# Patient Record
Sex: Male | Born: 1975 | Race: White | Hispanic: No
Health system: Southern US, Community
[De-identification: ages and names within clinical notes are randomized; demographics above are authoritative.]

## PROBLEM LIST (undated history)

## (undated) DIAGNOSIS — K7460 Unspecified cirrhosis of liver: Secondary | ICD-10-CM

## (undated) DIAGNOSIS — K769 Liver disease, unspecified: Secondary | ICD-10-CM

---

## 1999-08-01 ENCOUNTER — Emergency Department (HOSPITAL_COMMUNITY): Admission: EM | Admit: 1999-08-01 | Discharge: 1999-08-01 | Payer: Self-pay | Admitting: Emergency Medicine

## 1999-08-02 ENCOUNTER — Emergency Department (HOSPITAL_COMMUNITY): Admission: EM | Admit: 1999-08-02 | Discharge: 1999-08-02 | Payer: Self-pay | Admitting: Emergency Medicine

## 2020-10-27 ENCOUNTER — Inpatient Hospital Stay (HOSPITAL_COMMUNITY): Payer: 59

## 2020-10-27 ENCOUNTER — Encounter (HOSPITAL_COMMUNITY): Payer: Self-pay | Admitting: Emergency Medicine

## 2020-10-27 ENCOUNTER — Emergency Department (HOSPITAL_COMMUNITY): Payer: 59

## 2020-10-27 ENCOUNTER — Inpatient Hospital Stay (HOSPITAL_COMMUNITY)
Admission: EM | Admit: 2020-10-27 | Discharge: 2020-11-16 | DRG: 870 | Disposition: E | Payer: 59 | Attending: Internal Medicine | Admitting: Internal Medicine

## 2020-10-27 DIAGNOSIS — F10239 Alcohol dependence with withdrawal, unspecified: Secondary | ICD-10-CM | POA: Diagnosis present

## 2020-10-27 DIAGNOSIS — K567 Ileus, unspecified: Secondary | ICD-10-CM | POA: Diagnosis present

## 2020-10-27 DIAGNOSIS — A419 Sepsis, unspecified organism: Secondary | ICD-10-CM | POA: Diagnosis present

## 2020-10-27 DIAGNOSIS — E861 Hypovolemia: Secondary | ICD-10-CM | POA: Diagnosis present

## 2020-10-27 DIAGNOSIS — N170 Acute kidney failure with tubular necrosis: Secondary | ICD-10-CM | POA: Diagnosis present

## 2020-10-27 DIAGNOSIS — G9341 Metabolic encephalopathy: Secondary | ICD-10-CM | POA: Diagnosis not present

## 2020-10-27 DIAGNOSIS — E871 Hypo-osmolality and hyponatremia: Secondary | ICD-10-CM | POA: Diagnosis present

## 2020-10-27 DIAGNOSIS — E872 Acidosis: Secondary | ICD-10-CM | POA: Diagnosis present

## 2020-10-27 DIAGNOSIS — T17800A Unspecified foreign body in other parts of respiratory tract causing asphyxiation, initial encounter: Secondary | ICD-10-CM

## 2020-10-27 DIAGNOSIS — K7030 Alcoholic cirrhosis of liver without ascites: Secondary | ICD-10-CM | POA: Diagnosis present

## 2020-10-27 DIAGNOSIS — J9601 Acute respiratory failure with hypoxia: Secondary | ICD-10-CM

## 2020-10-27 DIAGNOSIS — Z66 Do not resuscitate: Secondary | ICD-10-CM | POA: Diagnosis not present

## 2020-10-27 DIAGNOSIS — J80 Acute respiratory distress syndrome: Secondary | ICD-10-CM | POA: Diagnosis present

## 2020-10-27 DIAGNOSIS — E162 Hypoglycemia, unspecified: Secondary | ICD-10-CM | POA: Diagnosis present

## 2020-10-27 DIAGNOSIS — Z515 Encounter for palliative care: Secondary | ICD-10-CM | POA: Diagnosis not present

## 2020-10-27 DIAGNOSIS — Z87442 Personal history of urinary calculi: Secondary | ICD-10-CM

## 2020-10-27 DIAGNOSIS — G931 Anoxic brain damage, not elsewhere classified: Secondary | ICD-10-CM | POA: Diagnosis present

## 2020-10-27 DIAGNOSIS — D65 Disseminated intravascular coagulation [defibrination syndrome]: Secondary | ICD-10-CM | POA: Diagnosis present

## 2020-10-27 DIAGNOSIS — R578 Other shock: Secondary | ICD-10-CM | POA: Diagnosis present

## 2020-10-27 DIAGNOSIS — R1915 Other abnormal bowel sounds: Secondary | ICD-10-CM

## 2020-10-27 DIAGNOSIS — Z452 Encounter for adjustment and management of vascular access device: Secondary | ICD-10-CM

## 2020-10-27 DIAGNOSIS — K7010 Alcoholic hepatitis without ascites: Secondary | ICD-10-CM | POA: Diagnosis present

## 2020-10-27 DIAGNOSIS — Z20822 Contact with and (suspected) exposure to covid-19: Secondary | ICD-10-CM | POA: Diagnosis present

## 2020-10-27 DIAGNOSIS — R092 Respiratory arrest: Secondary | ICD-10-CM

## 2020-10-27 DIAGNOSIS — I469 Cardiac arrest, cause unspecified: Secondary | ICD-10-CM | POA: Diagnosis present

## 2020-10-27 DIAGNOSIS — R569 Unspecified convulsions: Secondary | ICD-10-CM | POA: Diagnosis present

## 2020-10-27 DIAGNOSIS — J690 Pneumonitis due to inhalation of food and vomit: Secondary | ICD-10-CM | POA: Diagnosis present

## 2020-10-27 DIAGNOSIS — D638 Anemia in other chronic diseases classified elsewhere: Secondary | ICD-10-CM | POA: Diagnosis present

## 2020-10-27 DIAGNOSIS — Z79899 Other long term (current) drug therapy: Secondary | ICD-10-CM

## 2020-10-27 DIAGNOSIS — R6521 Severe sepsis with septic shock: Secondary | ICD-10-CM | POA: Diagnosis present

## 2020-10-27 DIAGNOSIS — K7290 Hepatic failure, unspecified without coma: Secondary | ICD-10-CM | POA: Diagnosis not present

## 2020-10-27 DIAGNOSIS — Z9911 Dependence on respirator [ventilator] status: Secondary | ICD-10-CM | POA: Diagnosis not present

## 2020-10-27 DIAGNOSIS — K767 Hepatorenal syndrome: Secondary | ICD-10-CM | POA: Diagnosis present

## 2020-10-27 DIAGNOSIS — J9600 Acute respiratory failure, unspecified whether with hypoxia or hypercapnia: Secondary | ICD-10-CM

## 2020-10-27 DIAGNOSIS — K922 Gastrointestinal hemorrhage, unspecified: Secondary | ICD-10-CM | POA: Diagnosis present

## 2020-10-27 DIAGNOSIS — Z8719 Personal history of other diseases of the digestive system: Secondary | ICD-10-CM

## 2020-10-27 DIAGNOSIS — Z7189 Other specified counseling: Secondary | ICD-10-CM | POA: Diagnosis not present

## 2020-10-27 HISTORY — DX: Liver disease, unspecified: K76.9

## 2020-10-27 HISTORY — DX: Unspecified cirrhosis of liver: K74.60

## 2020-10-27 LAB — POCT I-STAT 7, (LYTES, BLD GAS, ICA,H+H)
Acid-base deficit: 7 mmol/L — ABNORMAL HIGH (ref 0.0–2.0)
Bicarbonate: 18.8 mmol/L — ABNORMAL LOW (ref 20.0–28.0)
Calcium, Ion: 0.97 mmol/L — ABNORMAL LOW (ref 1.15–1.40)
HCT: 38 % — ABNORMAL LOW (ref 39.0–52.0)
Hemoglobin: 12.9 g/dL — ABNORMAL LOW (ref 13.0–17.0)
O2 Saturation: 99 %
Patient temperature: 97.4
Potassium: 4.2 mmol/L (ref 3.5–5.1)
Sodium: 130 mmol/L — ABNORMAL LOW (ref 135–145)
TCO2: 20 mmol/L — ABNORMAL LOW (ref 22–32)
pCO2 arterial: 38.6 mmHg (ref 32.0–48.0)
pH, Arterial: 7.293 — ABNORMAL LOW (ref 7.350–7.450)
pO2, Arterial: 139 mmHg — ABNORMAL HIGH (ref 83.0–108.0)

## 2020-10-27 LAB — CBC WITH DIFFERENTIAL/PLATELET
Abs Immature Granulocytes: 0.1 10*3/uL — ABNORMAL HIGH (ref 0.00–0.07)
Basophils Absolute: 0 10*3/uL (ref 0.0–0.1)
Basophils Relative: 1 %
Eosinophils Absolute: 0.9 10*3/uL — ABNORMAL HIGH (ref 0.0–0.5)
Eosinophils Relative: 14 %
HCT: 32.8 % — ABNORMAL LOW (ref 39.0–52.0)
Hemoglobin: 11.6 g/dL — ABNORMAL LOW (ref 13.0–17.0)
Immature Granulocytes: 2 %
Lymphocytes Relative: 15 %
Lymphs Abs: 0.9 10*3/uL (ref 0.7–4.0)
MCH: 38.3 pg — ABNORMAL HIGH (ref 26.0–34.0)
MCHC: 35.4 g/dL (ref 30.0–36.0)
MCV: 108.3 fL — ABNORMAL HIGH (ref 80.0–100.0)
Monocytes Absolute: 0.5 10*3/uL (ref 0.1–1.0)
Monocytes Relative: 8 %
Neutro Abs: 3.9 10*3/uL (ref 1.7–7.7)
Neutrophils Relative %: 60 %
Platelets: 48 10*3/uL — ABNORMAL LOW (ref 150–400)
RBC: 3.03 MIL/uL — ABNORMAL LOW (ref 4.22–5.81)
RDW: 16.6 % — ABNORMAL HIGH (ref 11.5–15.5)
WBC: 6.4 10*3/uL (ref 4.0–10.5)
nRBC: 1.3 % — ABNORMAL HIGH (ref 0.0–0.2)

## 2020-10-27 LAB — AMMONIA: Ammonia: 298 umol/L — ABNORMAL HIGH (ref 9–35)

## 2020-10-27 LAB — I-STAT ARTERIAL BLOOD GAS, ED
Acid-base deficit: 13 mmol/L — ABNORMAL HIGH (ref 0.0–2.0)
Bicarbonate: 15.3 mmol/L — ABNORMAL LOW (ref 20.0–28.0)
Calcium, Ion: 1 mmol/L — ABNORMAL LOW (ref 1.15–1.40)
HCT: 35 % — ABNORMAL LOW (ref 39.0–52.0)
Hemoglobin: 11.9 g/dL — ABNORMAL LOW (ref 13.0–17.0)
O2 Saturation: 98 %
Patient temperature: 93.2
Potassium: 3.9 mmol/L (ref 3.5–5.1)
Sodium: 127 mmol/L — ABNORMAL LOW (ref 135–145)
TCO2: 17 mmol/L — ABNORMAL LOW (ref 22–32)
pCO2 arterial: 38 mmHg (ref 32.0–48.0)
pH, Arterial: 7.196 — CL (ref 7.350–7.450)
pO2, Arterial: 112 mmHg — ABNORMAL HIGH (ref 83.0–108.0)

## 2020-10-27 LAB — I-STAT CHEM 8, ED
BUN: 27 mg/dL — ABNORMAL HIGH (ref 6–20)
Calcium, Ion: 0.82 mmol/L — CL (ref 1.15–1.40)
Chloride: 96 mmol/L — ABNORMAL LOW (ref 98–111)
Creatinine, Ser: 1.2 mg/dL (ref 0.61–1.24)
Glucose, Bld: 61 mg/dL — ABNORMAL LOW (ref 70–99)
HCT: 42 % (ref 39.0–52.0)
Hemoglobin: 14.3 g/dL (ref 13.0–17.0)
Potassium: 4.7 mmol/L (ref 3.5–5.1)
Sodium: 124 mmol/L — ABNORMAL LOW (ref 135–145)
TCO2: 15 mmol/L — ABNORMAL LOW (ref 22–32)

## 2020-10-27 LAB — COMPREHENSIVE METABOLIC PANEL
ALT: 111 U/L — ABNORMAL HIGH (ref 0–44)
AST: 264 U/L — ABNORMAL HIGH (ref 15–41)
Albumin: 1.7 g/dL — ABNORMAL LOW (ref 3.5–5.0)
Alkaline Phosphatase: 104 U/L (ref 38–126)
Anion gap: 23 — ABNORMAL HIGH (ref 5–15)
BUN: 19 mg/dL (ref 6–20)
CO2: 13 mmol/L — ABNORMAL LOW (ref 22–32)
Calcium: 8.1 mg/dL — ABNORMAL LOW (ref 8.9–10.3)
Chloride: 90 mmol/L — ABNORMAL LOW (ref 98–111)
Creatinine, Ser: 1.54 mg/dL — ABNORMAL HIGH (ref 0.61–1.24)
GFR, Estimated: 57 mL/min — ABNORMAL LOW (ref >60)
Glucose, Bld: 65 mg/dL — ABNORMAL LOW (ref 70–99)
Potassium: 5.3 mmol/L — ABNORMAL HIGH (ref 3.5–5.1)
Sodium: 126 mmol/L — ABNORMAL LOW (ref 135–145)
Total Bilirubin: 9.1 mg/dL — ABNORMAL HIGH (ref 0.3–1.2)
Total Protein: 7.2 g/dL (ref 6.5–8.1)

## 2020-10-27 LAB — I-STAT VENOUS BLOOD GAS, ED
Acid-base deficit: 15 mmol/L — ABNORMAL HIGH (ref 0.0–2.0)
Bicarbonate: 12 mmol/L — ABNORMAL LOW (ref 20.0–28.0)
Calcium, Ion: 0.8 mmol/L — CL (ref 1.15–1.40)
HCT: 41 % (ref 39.0–52.0)
Hemoglobin: 13.9 g/dL (ref 13.0–17.0)
O2 Saturation: 97 %
Potassium: 4.7 mmol/L (ref 3.5–5.1)
Sodium: 125 mmol/L — ABNORMAL LOW (ref 135–145)
TCO2: 13 mmol/L — ABNORMAL LOW (ref 22–32)
pCO2, Ven: 31.9 mmHg — ABNORMAL LOW (ref 44.0–60.0)
pH, Ven: 7.182 — CL (ref 7.250–7.430)
pO2, Ven: 115 mmHg — ABNORMAL HIGH (ref 32.0–45.0)

## 2020-10-27 LAB — MAGNESIUM
Magnesium: 3 mg/dL — ABNORMAL HIGH (ref 1.7–2.4)
Magnesium: 2.1 mg/dL (ref 1.7–2.4)

## 2020-10-27 LAB — CBC
HCT: 38.9 % — ABNORMAL LOW (ref 39.0–52.0)
HCT: 32.8 % — ABNORMAL LOW (ref 39.0–52.0)
Hemoglobin: 13 g/dL (ref 13.0–17.0)
Hemoglobin: 11.4 g/dL — ABNORMAL LOW (ref 13.0–17.0)
MCH: 38.5 pg — ABNORMAL HIGH (ref 26.0–34.0)
MCH: 38.8 pg — ABNORMAL HIGH (ref 26.0–34.0)
MCHC: 33.4 g/dL (ref 30.0–36.0)
MCHC: 34.8 g/dL (ref 30.0–36.0)
MCV: 115.1 fL — ABNORMAL HIGH (ref 80.0–100.0)
MCV: 111.6 fL — ABNORMAL HIGH (ref 80.0–100.0)
Platelets: 67 10*3/uL — ABNORMAL LOW (ref 150–400)
Platelets: 49 10*3/uL — ABNORMAL LOW (ref 150–400)
RBC: 3.38 MIL/uL — ABNORMAL LOW (ref 4.22–5.81)
RBC: 2.94 MIL/uL — ABNORMAL LOW (ref 4.22–5.81)
RDW: 16.9 % — ABNORMAL HIGH (ref 11.5–15.5)
RDW: 16.8 % — ABNORMAL HIGH (ref 11.5–15.5)
WBC: 12.8 10*3/uL — ABNORMAL HIGH (ref 4.0–10.5)
WBC: 6.4 10*3/uL (ref 4.0–10.5)
nRBC: 1.2 % — ABNORMAL HIGH (ref 0.0–0.2)
nRBC: 1.4 % — ABNORMAL HIGH (ref 0.0–0.2)

## 2020-10-27 LAB — BASIC METABOLIC PANEL
Anion gap: 25 — ABNORMAL HIGH (ref 5–15)
BUN: 19 mg/dL (ref 6–20)
CO2: 16 mmol/L — ABNORMAL LOW (ref 22–32)
Calcium: 8.2 mg/dL — ABNORMAL LOW (ref 8.9–10.3)
Chloride: 89 mmol/L — ABNORMAL LOW (ref 98–111)
Creatinine, Ser: 1.61 mg/dL — ABNORMAL HIGH (ref 0.61–1.24)
GFR, Estimated: 54 mL/min — ABNORMAL LOW (ref >60)
Glucose, Bld: 72 mg/dL (ref 70–99)
Potassium: 3.3 mmol/L — ABNORMAL LOW (ref 3.5–5.1)
Sodium: 130 mmol/L — ABNORMAL LOW (ref 135–145)

## 2020-10-27 LAB — CBG MONITORING, ED
Glucose-Capillary: 66 mg/dL — ABNORMAL LOW (ref 70–99)
Glucose-Capillary: 101 mg/dL — ABNORMAL HIGH (ref 70–99)

## 2020-10-27 LAB — TYPE AND SCREEN
ABO/RH(D): O POS
Antibody Screen: NEGATIVE

## 2020-10-27 LAB — RESP PANEL BY RT-PCR (FLU A&B, COVID) ARPGX2
Influenza A by PCR: NEGATIVE
Influenza B by PCR: NEGATIVE
SARS Coronavirus 2 by RT PCR: NEGATIVE

## 2020-10-27 LAB — PROTIME-INR
INR: 3.8 — ABNORMAL HIGH (ref 0.8–1.2)
Prothrombin Time: 36 seconds — ABNORMAL HIGH (ref 11.4–15.2)

## 2020-10-27 LAB — MRSA PCR SCREENING: MRSA by PCR: NEGATIVE

## 2020-10-27 LAB — LACTIC ACID, PLASMA
Lactic Acid, Venous: >11 mmol/L (ref 0.5–1.9)
Lactic Acid, Venous: >11 mmol/L (ref 0.5–1.9)
Lactic Acid, Venous: >11 mmol/L (ref 0.5–1.9)

## 2020-10-27 LAB — HIV ANTIBODY (ROUTINE TESTING W REFLEX): HIV Screen 4th Generation wRfx: NONREACTIVE

## 2020-10-27 LAB — ABO/RH: ABO/RH(D): O POS

## 2020-10-27 LAB — TROPONIN I (HIGH SENSITIVITY)
Troponin I (High Sensitivity): 41 ng/L — ABNORMAL HIGH (ref <18)
Troponin I (High Sensitivity): 41 ng/L — ABNORMAL HIGH (ref <18)
Troponin I (High Sensitivity): 80 ng/L — ABNORMAL HIGH (ref <18)

## 2020-10-27 LAB — GLUCOSE, CAPILLARY
Glucose-Capillary: 63 mg/dL — ABNORMAL LOW (ref 70–99)
Glucose-Capillary: 90 mg/dL (ref 70–99)

## 2020-10-27 LAB — FIBRINOGEN: Fibrinogen: 77 mg/dL — CL (ref 210–475)

## 2020-10-27 MED ORDER — ONDANSETRON HCL 4 MG/2ML IJ SOLN: 4.0000 mg | Freq: Four times a day (QID) | INTRAMUSCULAR | Status: DC | PRN

## 2020-10-27 MED ORDER — SODIUM CHLORIDE 0.9% IV SOLUTION: Freq: Once | INTRAVENOUS | Status: DC

## 2020-10-27 MED ORDER — VASOPRESSIN 20 UNITS/100 ML INFUSION FOR SHOCK
0.0400 [IU]/min | INTRAVENOUS | Status: DC
  Administered 2020-10-28 – 2020-11-01 (×11): 0.04 [IU]/min via INTRAVENOUS
  Filled 2020-10-27 (×12): qty 100

## 2020-10-27 MED ORDER — FENTANYL BOLUS VIA INFUSION
50.0000 ug | INTRAVENOUS | Status: DC | PRN
  Administered 2020-10-28 – 2020-11-01 (×4): 50 ug via INTRAVENOUS
  Filled 2020-10-27: qty 50

## 2020-10-27 MED ORDER — ORAL CARE MOUTH RINSE
15.0000 mL | OROMUCOSAL | Status: DC
  Administered 2020-10-28 – 2020-11-01 (×46): 15 mL via OROMUCOSAL

## 2020-10-27 MED ORDER — CALCIUM GLUCONATE 10 % IV SOLN: 1.0000 g | Freq: Once | INTRAVENOUS | Status: DC

## 2020-10-27 MED ORDER — FENTANYL CITRATE (PF) 100 MCG/2ML IJ SOLN: 100.0000 ug | Freq: Once | INTRAMUSCULAR | Status: AC

## 2020-10-27 MED ORDER — FOLIC ACID 5 MG/ML IJ SOLN
1.0000 mg | Freq: Every day | INTRAMUSCULAR | Status: DC
  Administered 2020-10-28 – 2020-11-01 (×5): 1 mg via INTRAVENOUS
  Filled 2020-10-27 (×5): qty 0.2

## 2020-10-27 MED ORDER — SODIUM CHLORIDE 0.9 % IV SOLN: 1.0000 g | Freq: Once | INTRAVENOUS | Status: DC

## 2020-10-27 MED ORDER — NOREPINEPHRINE 16 MG/250ML-% IV SOLN
0.0000 ug/min | INTRAVENOUS | Status: DC
  Administered 2020-10-27: 10 ug/min via INTRAVENOUS
  Administered 2020-10-28: 30 ug/min via INTRAVENOUS
  Administered 2020-10-28: 45 ug/min via INTRAVENOUS
  Administered 2020-10-29: 60 ug/min via INTRAVENOUS
  Administered 2020-10-29: 55 ug/min via INTRAVENOUS
  Administered 2020-10-29: 45 ug/min via INTRAVENOUS
  Administered 2020-10-29: 55 ug/min via INTRAVENOUS
  Administered 2020-10-30: 10 ug/min via INTRAVENOUS
  Administered 2020-11-01: 25 ug/min via INTRAVENOUS
  Administered 2020-11-01: 32 ug/min via INTRAVENOUS
  Filled 2020-10-27 (×11): qty 250

## 2020-10-27 MED ORDER — VANCOMYCIN HCL 1750 MG/350ML IV SOLN
1750.0000 mg | Freq: Once | INTRAVENOUS | Status: AC
  Administered 2020-10-28: 1750 mg via INTRAVENOUS
  Filled 2020-10-27: qty 350

## 2020-10-27 MED ORDER — CHLORHEXIDINE GLUCONATE CLOTH 2 % EX PADS
6.0000 | MEDICATED_PAD | Freq: Every day | CUTANEOUS | Status: DC
  Administered 2020-10-29 – 2020-10-31 (×4): 6 via TOPICAL

## 2020-10-27 MED ORDER — SODIUM CHLORIDE 0.9 % IV SOLN: INTRAVENOUS | Status: DC | PRN

## 2020-10-27 MED ORDER — CALCIUM GLUCONATE-NACL 2-0.675 GM/100ML-% IV SOLN
2.0000 g | Freq: Once | INTRAVENOUS | Status: AC
  Administered 2020-10-27: 2000 mg via INTRAVENOUS
  Filled 2020-10-27: qty 100

## 2020-10-27 MED ORDER — ETOMIDATE 2 MG/ML IV SOLN
INTRAVENOUS | Status: AC | PRN
  Administered 2020-10-27: 20 mg via INTRAVENOUS

## 2020-10-27 MED ORDER — DOCUSATE SODIUM 100 MG PO CAPS: 100.0000 mg | ORAL_CAPSULE | Freq: Two times a day (BID) | ORAL | Status: DC | PRN

## 2020-10-27 MED ORDER — CHLORHEXIDINE GLUCONATE 0.12% ORAL RINSE (MEDLINE KIT)
15.0000 mL | Freq: Two times a day (BID) | OROMUCOSAL | Status: DC
  Administered 2020-10-27 – 2020-11-01 (×10): 15 mL via OROMUCOSAL

## 2020-10-27 MED ORDER — CALCIUM CHLORIDE 10 % IV SOLN: 1.0000 g | Freq: Once | INTRAVENOUS | Status: DC

## 2020-10-27 MED ORDER — CALCIUM GLUCONATE-NACL 1-0.675 GM/50ML-% IV SOLN
1.0000 g | Freq: Once | INTRAVENOUS | Status: AC
  Administered 2020-10-27: 1000 mg via INTRAVENOUS

## 2020-10-27 MED ORDER — OCTREOTIDE LOAD VIA INFUSION
50.0000 ug | Freq: Once | INTRAVENOUS | Status: AC
  Administered 2020-10-27: 50 ug via INTRAVENOUS
  Filled 2020-10-27: qty 25

## 2020-10-27 MED ORDER — SODIUM BICARBONATE 8.4 % IV SOLN: 50.0000 meq | Freq: Once | INTRAVENOUS | Status: AC

## 2020-10-27 MED ORDER — POLYETHYLENE GLYCOL 3350 17 G PO PACK: 17.0000 g | PACK | Freq: Every day | ORAL | Status: DC | PRN

## 2020-10-27 MED ORDER — THIAMINE HCL 100 MG/ML IJ SOLN: 100.0000 mg | Freq: Once | INTRAMUSCULAR | Status: DC

## 2020-10-27 MED ORDER — DEXTROSE 50 % IV SOLN
INTRAVENOUS | Status: AC
  Administered 2020-10-27: 25 mL via INTRAVENOUS
  Filled 2020-10-27: qty 50

## 2020-10-27 MED ORDER — VANCOMYCIN HCL 1000 MG/200ML IV SOLN
1000.0000 mg | Freq: Two times a day (BID) | INTRAVENOUS | Status: DC
  Filled 2020-10-27: qty 200

## 2020-10-27 MED ORDER — PANTOPRAZOLE SODIUM 40 MG IV SOLR
40.0000 mg | Freq: Two times a day (BID) | INTRAVENOUS | Status: DC
  Administered 2020-10-31 – 2020-11-01 (×3): 40 mg via INTRAVENOUS
  Filled 2020-10-27 (×3): qty 40

## 2020-10-27 MED ORDER — SODIUM CHLORIDE 0.9 % IV SOLN
8.0000 mg/h | INTRAVENOUS | Status: DC
  Administered 2020-10-27 – 2020-10-28 (×2): 8 mg/h via INTRAVENOUS
  Filled 2020-10-27 (×3): qty 80

## 2020-10-27 MED ORDER — FENTANYL CITRATE (PF) 100 MCG/2ML IJ SOLN
100.0000 ug | INTRAMUSCULAR | Status: DC | PRN
  Administered 2020-10-27 – 2020-11-01 (×12): 100 ug via INTRAVENOUS
  Filled 2020-10-27 (×8): qty 2

## 2020-10-27 MED ORDER — PIPERACILLIN-TAZOBACTAM 3.375 G IVPB
3.3750 g | Freq: Three times a day (TID) | INTRAVENOUS | Status: DC
  Administered 2020-10-28 – 2020-10-30 (×7): 3.375 g via INTRAVENOUS
  Filled 2020-10-27 (×8): qty 50

## 2020-10-27 MED ORDER — PROPOFOL 1000 MG/100ML IV EMUL
5.0000 ug/kg/min | INTRAVENOUS | Status: DC
  Administered 2020-10-28: 5 ug/kg/min via INTRAVENOUS
  Administered 2020-10-28: 20 ug/kg/min via INTRAVENOUS
  Filled 2020-10-27: qty 100

## 2020-10-27 MED ORDER — THIAMINE HCL 100 MG/ML IJ SOLN
500.0000 mg | Freq: Three times a day (TID) | INTRAVENOUS | Status: AC
  Administered 2020-10-28 – 2020-10-29 (×6): 500 mg via INTRAVENOUS
  Filled 2020-10-27 (×6): qty 5

## 2020-10-27 MED ORDER — FENTANYL 2500MCG IN NS 250ML (10MCG/ML) PREMIX INFUSION
0.0000 ug/h | INTRAVENOUS | Status: DC
  Administered 2020-10-27: 50 ug/h via INTRAVENOUS
  Administered 2020-10-28: 125 ug/h via INTRAVENOUS
  Administered 2020-10-31: 200 ug/h via INTRAVENOUS
  Administered 2020-11-01: 275 ug/h via INTRAVENOUS
  Administered 2020-11-01: 250 ug/h via INTRAVENOUS
  Filled 2020-10-27 (×6): qty 250

## 2020-10-27 MED ORDER — LACTATED RINGERS IV BOLUS
1000.0000 mL | Freq: Once | INTRAVENOUS | Status: AC
  Administered 2020-10-27: 1000 mL via INTRAVENOUS

## 2020-10-27 MED ORDER — SODIUM BICARBONATE 8.4 % IV SOLN
100.0000 meq | Freq: Once | INTRAVENOUS | Status: AC
  Administered 2020-10-27: 100 meq via INTRAVENOUS
  Filled 2020-10-27: qty 50

## 2020-10-27 MED ORDER — PIPERACILLIN-TAZOBACTAM 3.375 G IVPB 30 MIN
3.3750 g | Freq: Once | INTRAVENOUS | Status: AC
  Administered 2020-10-27: 3.375 g via INTRAVENOUS
  Filled 2020-10-27: qty 50

## 2020-10-27 MED ORDER — SODIUM CHLORIDE 0.9 % IV SOLN
80.0000 mg | Freq: Once | INTRAVENOUS | Status: AC
  Administered 2020-10-27: 80 mg via INTRAVENOUS
  Filled 2020-10-27: qty 80

## 2020-10-27 MED ORDER — DEXTROSE IN LACTATED RINGERS 5 % IV SOLN: INTRAVENOUS | Status: DC

## 2020-10-27 MED ORDER — DEXTROSE 50 % IV SOLN
25.0000 mL | Freq: Once | INTRAVENOUS | Status: AC
  Filled 2020-10-27: qty 50

## 2020-10-27 MED ORDER — SODIUM CHLORIDE 0.9 % IV BOLUS
1000.0000 mL | Freq: Once | INTRAVENOUS | Status: AC
  Administered 2020-10-27: 1000 mL via INTRAVENOUS

## 2020-10-27 MED ORDER — ALBUMIN HUMAN 25 % IV SOLN
50.0000 g | Freq: Once | INTRAVENOUS | Status: AC
  Administered 2020-10-27: 50 g via INTRAVENOUS
  Filled 2020-10-27: qty 200

## 2020-10-27 MED ORDER — THIAMINE HCL 100 MG/ML IJ SOLN
500.0000 mg | Freq: Every day | INTRAVENOUS | Status: DC
  Administered 2020-10-28: 500 mg via INTRAVENOUS
  Filled 2020-10-27 (×2): qty 5

## 2020-10-27 MED ORDER — NOREPINEPHRINE 4 MG/250ML-% IV SOLN
2.0000 ug/min | INTRAVENOUS | Status: DC
  Administered 2020-10-27: 4 ug/min via INTRAVENOUS

## 2020-10-27 MED ORDER — SODIUM CHLORIDE 0.9 % IV SOLN
50.0000 ug/h | INTRAVENOUS | Status: DC
  Administered 2020-10-27 – 2020-10-28 (×2): 50 ug/h via INTRAVENOUS
  Filled 2020-10-27 (×2): qty 1

## 2020-10-27 MED ORDER — SODIUM BICARBONATE 8.4 % IV SOLN
INTRAVENOUS | Status: AC
  Administered 2020-10-27: 50 meq via INTRAVENOUS
  Filled 2020-10-27: qty 50

## 2020-10-27 MED ORDER — LACTULOSE 10 GM/15ML PO SOLN
30.0000 g | Freq: Two times a day (BID) | ORAL | Status: DC
  Administered 2020-10-27 – 2020-10-29 (×5): 30 g
  Filled 2020-10-27 (×5): qty 45

## 2020-10-27 MED ORDER — CALCIUM CHLORIDE 10 % IV SOLN
INTRAVENOUS | Status: AC
  Filled 2020-10-27: qty 10

## 2020-10-27 MED ORDER — LACTATED RINGERS IV BOLUS: 1000.0000 mL | Freq: Once | INTRAVENOUS | Status: DC

## 2020-10-27 MED ORDER — SODIUM CHLORIDE 0.9 % IV SOLN
250.0000 mL | INTRAVENOUS | Status: DC
  Administered 2020-10-30 – 2020-11-01 (×2): 250 mL via INTRAVENOUS

## 2020-10-27 MED ORDER — FENTANYL CITRATE (PF) 100 MCG/2ML IJ SOLN
INTRAMUSCULAR | Status: AC
  Administered 2020-10-27: 100 ug via INTRAVENOUS
  Filled 2020-10-27: qty 2

## 2020-10-27 MED ORDER — CHLORHEXIDINE GLUCONATE CLOTH 2 % EX PADS
6.0000 | MEDICATED_PAD | Freq: Every day | CUTANEOUS | Status: DC
  Administered 2020-10-27: 6 via TOPICAL

## 2020-10-27 MED ORDER — SODIUM CHLORIDE 0.9 % IV SOLN: 250.0000 mL | INTRAVENOUS | Status: DC

## 2020-10-27 MED ORDER — FENTANYL CITRATE (PF) 100 MCG/2ML IJ SOLN
50.0000 ug | Freq: Once | INTRAMUSCULAR | Status: AC
  Administered 2020-10-27: 50 ug via INTRAVENOUS
  Filled 2020-10-27: qty 2

## 2020-10-27 MED ORDER — VITAMIN K1 10 MG/ML IJ SOLN
5.0000 mg | Freq: Once | INTRAVENOUS | Status: AC
  Administered 2020-10-27: 5 mg via INTRAVENOUS
  Filled 2020-10-27: qty 0.5

## 2020-10-27 MED ORDER — SUCCINYLCHOLINE CHLORIDE 20 MG/ML IJ SOLN
INTRAMUSCULAR | Status: AC | PRN
Start: 2020-10-27 — End: 2020-10-27
  Administered 2020-10-27: 100 mg via INTRAVENOUS

## 2020-10-27 MED ORDER — FENTANYL CITRATE (PF) 100 MCG/2ML IJ SOLN
INTRAMUSCULAR | Status: AC
  Filled 2020-10-27: qty 2

## 2020-10-27 NOTE — Procedures (Signed)
Arterial Catheter Insertion Procedure Note  Logan Shaw  [id number]  July 07, 1976  Date:10/22/2020  Time:8:54 PM    Provider Performing: Epifanio Lesches A    Procedure: Insertion of Arterial Line (35009) without US guidance  Indication(s) Blood pressure monitoring and/or need for frequent ABGs  Consent Risks of the procedure as well as the alternatives and risks of each were explained to the patient and/or caregiver.  Consent for the procedure was obtained and is signed in the bedside chart  Anesthesia None   Time Out Verified patient identification, verified procedure, site/side was marked, verified correct patient position, special equipment/implants available, medications/allergies/relevant history reviewed, required imaging and test results available.   Sterile Technique Maximal sterile technique including full sterile barrier drape, hand hygiene, sterile gown, sterile gloves, mask, hair covering, sterile ultrasound probe cover (if used).   Procedure Description Area of catheter insertion was cleaned with chlorhexidine and draped in sterile fashion. Without real-time ultrasound guidance an arterial catheter was placed into the left radial artery.  Appropriate arterial tracings confirmed on monitor.     Complications/Tolerance None; patient tolerated the procedure well.   EBL Minimal   Specimen(s) None

## 2020-10-27 NOTE — Progress Notes (Signed)
Pharmacy Antibiotic Note  Logan Shaw is a 45 y.o. male admitted on 11-10-20 with pneumonia.  Pharmacy has been consulted for Zosyn and Vancomycin dosing.   Height: 5\' 4"  (162.6 cm) Weight: 82.2 kg (181 lb 3.5 oz) IBW/kg (Calculated) : 59.2  No data recorded.  Recent Labs  Lab 11-10-2020 1746 11-10-20 1756  WBC 12.8*  --   CREATININE  --  1.20    Estimated Creatinine Clearance: 77.6 mL/min (by C-G formula based on SCr of 1.2 mg/dL).    No Known Allergies  Antimicrobials this admission: 3/12 Zosyn >>  3/12 Vancomycin >>   Dose adjustments this admission: N/a  Microbiology results: Pending   Plan:  - Zosyn 3.375g IV x 1 dose over 30 min followed by Zosyn 3.375g IV every 8 hours infused over 4 hours  - Vancomycin 1750mg  IV x 1 dose  - Followed by Vancomycin 1000mg  IV q12h - Est Calc AUC 505 - Monitor patients renal function and urine output  - De-escalate ABX when appropriate   Thank you for allowing pharmacy to be a part of this patient's care.  5/12 PharmD. BCPS 11/10/20 6:43 PM

## 2020-10-27 NOTE — Progress Notes (Signed)
Dr. Coralie Common (CCM) ordered 1L LR bolus, a second dose of IVPB calcium gluconate, 50mg  albumin 25%, and Vasopressin 0.04 infusion --  said to transition off levophed as able once vaso is started. Also ordered fibrinogen level and d/c'd previously ordered FFP.  MD discussed plan of care with family, including insertion of CVC for vasoactive drips.  22:10 --  LIJ 3L CVC now inserted by above MD. Pt tolerated well.  22:45 -- Family now at bedside to visit pt.

## 2020-10-27 NOTE — Progress Notes (Signed)
eLink Physician-Brief Progress Note Patient Name: Logan Shaw DOB: [birth year] MRN: [id number]   Date of Service  10-29-20  HPI/Events of Note  45 year old male with hx of EtOH cirrhosis who presented after cardiac arrest, witnessed by wife, ROSC achieved after 12 minutes. He is now intubated and in ARDS and shock (most likely septic or due to metabolic disarray).  He is on PRVC 400x34, 8, 100%. There is some coffee ground contents in OG tube which is to LIWS. He has a cough and pupils are reactive. He is obtunded off all sedation.   eICU Interventions  # Neuro: - Target normothermia / fever prevention (no cooling in setting of coagulopathy) - Neuro c/s for prognostication (suspect HIE) - Thiamine given hx of EtOH abuse - Hyperammonemia: start lactulose BID per tube - Not on sedation at this time (has not regained consciousness)  # Respiratory: - Suspected massive aspiration resulting in bilateral infiltrates / ARDS: MV w/ ARDSNet protocol - Empiric treatment for aspiration pneumonia as below - F/u respiratory culture - Repeat ABG at 2300 hrs  # Cardiac: - Shock: Receiving LR bolus now for hypotension. Also needed to start levophed to maintain MAP >\= 65 mmHg. Will obtain A-line if hypotension does not respond to the bolus / escalating levophed requirements.  # ID: - Aspiration PNA + ? Septic shock: Vanc/Zosyn for now, f/u respiratory cultures and blood cultures.  # GI: - GI bleeding: Octreotide, Protonix IV BID, serial H/H, active T&S. - NPO for now - Monitor coags, LFTs - GI consult  # Endo: - Hypoglycemia: D5 LR @ 75 cc/hr, Q1H CBG for now (spacing out as tolerated)  # Renal: - AKI secondary to cardiac arrest: CTM - Lactic acidosis secondary to cardiac arrest: Repeat lactate to ensure resolution/downtrending - Hypocalcemia: Repletion per protocol - Moderate hyponatremia (126 on arrival): likely hypovolemic +/- low ECV (cirrhosis) in etiology  DVT PPX: SCDs only (active  bleeding / coagulopathy) GI PPX: Protonix IV BID as above CODE STATUS: Full     Intervention Category Evaluation Type: New Patient Evaluation  Janae Bridgeman October 29, 2020, 7:49 PM

## 2020-10-27 NOTE — ED Provider Notes (Signed)
[facility] EMERGENCY DEPARTMENT Provider Note   CSN: [id number] Arrival date & time: 10/21/2020  1734     History Chief Complaint  Patient presents with  . Cardiac Arrest    Logan Shaw is a 45 y.o. male.  The history is provided by the EMS personnel, a relative and medical records.   Logan Shaw is a 45 y.o. male who presents to the Emergency Department complaining of cardiac arrest. Level V caveat due to unresponsiveness. History is provided by EMS and the patient's wife. EMS reports that they were called out for witnessed cardiac arrest. He became unresponsive in front of his wife and bystander CPR was started. On EMS arrival he was found to be in asystole and CPR was continued. He was treated with two rounds of epi, 2 g of mag and King airway was placed. They had return of circulation. After return of circulation he needed 2.5 mg of versed due to biting the tube. EMS placed an I/O.  Additional history obtained by the patient's wife after his initial ED assessment. She states that he has a history of alcohol abuse. He is been complaining of G.I. problems for the last several months with intermittent blood he stools, abdominal pain, diarrhea and poor oral intake. Her PCP noted abnormal liver labs and he was referred to G.I. He was evaluated by G.I. yesterday. He was told to stop drinking in his last drink was Thursday night. This morning she was concerned that he was possibly in DTs because he was agitated and hallucinating. She checked pulse ox and it was 70. He refused to be assessed at that time. Later in the day he became unresponsive and blue. She started CPR and 911 was called.    Past Medical History:  Diagnosis Date  . Liver disease, chronic, with cirrhosis [facility])     Patient Active Problem List   Diagnosis Date Noted  . Cardiac arrest ([place]) 10/21/2020        No family history on file.     Home Medications Prior to Admission medications   Medication  Sig Start Date End Date Taking? Authorizing Provider  aspirin-acetaminophen-caffeine (EXCEDRIN MIGRAINE) [phone] MG tablet Take 2 tablets by mouth every 6 (six) hours as needed for headache.   Yes [provider]  dicyclomine (BENTYL) 10 MG capsule Take 10 mg by mouth 2 (two) times daily. 10/22/20  Yes [provider]  fluticasone (FLONASE) 50 MCG/ACT nasal spray Place 1 spray into both nostrils at bedtime as needed for allergies or rhinitis.   Yes [provider]  loperamide (IMODIUM) 2 MG capsule Take 2-4 mg by mouth See admin instructions. Take 2 capsules (4 mg) by mouth at 1st loose stool, diarrhea, then take 1 capsule (2 mg) after each subsequent loose stool, diarrhea   Yes [provider]  phenylephrine-shark liver oil-mineral oil-petrolatum (PREPARATION H) 0.25-14-74.9 % rectal ointment Place 1 application rectally 2 (two) times daily as needed for hemorrhoids.   Yes [provider]    Allergies    Patient has no known allergies.  Review of Systems   Review of Systems  Unable to perform ROS: Patient unresponsive    Physical Exam Updated Vital Signs BP (!) 124/31   Pulse (!) 112   Temp (!) 97.4 F (36.3 C) (Oral)   Resp (!) 36   Ht _0  (1.626 m)   Wt 82.2 kg   SpO2 94%   BMI 31.11 kg/m   Physical Exam Vitals and  nursing note reviewed.  Constitutional:      Appearance: He is well-developed. He is ill-appearing.  HENT:     Head: Normocephalic and atraumatic.     Comments: Blood in the posterior oropharynx. Coffee ground emesis on his shirt. Eyes:     General: Scleral icterus present.  Cardiovascular:     Rate and Rhythm: Regular rhythm. Tachycardia present.     Heart sounds: No murmur heard.   Pulmonary:     Effort: Pulmonary effort is normal. No respiratory distress.     Breath sounds: Normal breath sounds.  Abdominal:     Palpations: Abdomen is soft.     Tenderness: There is no abdominal tenderness. There is no  guarding or rebound.  Musculoskeletal:        General: Swelling present. No tenderness.     Comments: Pitting edema to bilateral lower extremities, left greater than right  Skin:    General: Skin is warm and dry.     Coloration: Skin is jaundiced and pale.  Neurological:     Comments: GCS one - one - one  Psychiatric:     Comments: Unable to assess     ED Results / Procedures / Treatments   Labs (all labs ordered are listed, but only abnormal results are displayed) Labs Reviewed  PROTIME-INR - Abnormal; Notable for the following components:      Result Value   Prothrombin Time 36.0 (*)    INR 3.8 (*)    All other components within normal limits  CBC - Abnormal; Notable for the following components:   WBC 12.8 (*)    RBC 3.38 (*)    HCT 38.9 (*)    MCV 115.1 (*)    MCH 38.5 (*)    RDW 16.9 (*)    Platelets 67 (*)    nRBC 1.2 (*)    All other components within normal limits  AMMONIA - Abnormal; Notable for the following components:   Ammonia 298 (*)    All other components within normal limits  LACTIC ACID, PLASMA - Abnormal; Notable for the following components:   Lactic Acid, Venous >11.0 (*)    All other components within normal limits  LACTIC ACID, PLASMA - Abnormal; Notable for the following components:   Lactic Acid, Venous >11.0 (*)    All other components within normal limits  COMPREHENSIVE METABOLIC PANEL - Abnormal; Notable for the following components:   Sodium 126 (*)    Potassium 5.3 (*)    Chloride 90 (*)    CO2 13 (*)    Glucose, Bld 65 (*)    Creatinine, Ser 1.54 (*)    Calcium 8.1 (*)    Albumin 1.7 (*)    AST 264 (*)    ALT 111 (*)    Total Bilirubin 9.1 (*)    GFR, Estimated 57 (*)    Anion gap 23 (*)    All other components within normal limits  MAGNESIUM - Abnormal; Notable for the following components:   Magnesium 3.0 (*)    All other components within normal limits  CBC - Abnormal; Notable for the following components:   RBC 2.94 (*)     Hemoglobin 11.4 (*)    HCT 32.8 (*)    MCV 111.6 (*)    MCH 38.8 (*)    RDW 16.8 (*)    Platelets 49 (*)    nRBC 1.4 (*)    All other components within normal limits  I-STAT CHEM 8, ED -  Abnormal; Notable for the following components:   Sodium 124 (*)    Chloride 96 (*)    BUN 27 (*)    Glucose, Bld 61 (*)    Calcium, Ion 0.82 (*)    TCO2 15 (*)    All other components within normal limits  I-STAT VENOUS BLOOD GAS, ED - Abnormal; Notable for the following components:   pH, Ven 7.182 (*)    pCO2, Ven 31.9 (*)    pO2, Ven 115.0 (*)    Bicarbonate 12.0 (*)    TCO2 13 (*)    Acid-base deficit 15.0 (*)    Sodium 125 (*)    Calcium, Ion 0.80 (*)    All other components within normal limits  CBG MONITORING, ED - Abnormal; Notable for the following components:   Glucose-Capillary 66 (*)    All other components within normal limits  CBG MONITORING, ED - Abnormal; Notable for the following components:   Glucose-Capillary 101 (*)    All other components within normal limits  I-STAT ARTERIAL BLOOD GAS, ED - Abnormal; Notable for the following components:   pH, Arterial 7.196 (*)    pO2, Arterial 112 (*)    Bicarbonate 15.3 (*)    TCO2 17 (*)    Acid-base deficit 13.0 (*)    Sodium 127 (*)    Calcium, Ion 1.00 (*)    HCT 35.0 (*)    Hemoglobin 11.9 (*)    All other components within normal limits  POCT I-STAT 7, (LYTES, BLD GAS, ICA,H+H) - Abnormal; Notable for the following components:   pH, Arterial 7.293 (*)    pO2, Arterial 139 (*)    Bicarbonate 18.8 (*)    TCO2 20 (*)    Acid-base deficit 7.0 (*)    Sodium 130 (*)    Calcium, Ion 0.97 (*)    HCT 38.0 (*)    Hemoglobin 12.9 (*)    All other components within normal limits  TROPONIN I (HIGH SENSITIVITY) - Abnormal; Notable for the following components:   Troponin I (High Sensitivity) 41 (*)    All other components within normal limits  TROPONIN I (HIGH SENSITIVITY) - Abnormal; Notable for the following components:    Troponin I (High Sensitivity) 41 (*)    All other components within normal limits  RESP PANEL BY RT-PCR (FLU A&B, COVID) ARPGX2  MRSA PCR SCREENING  CULTURE, BLOOD (ROUTINE X 2)  CULTURE, BLOOD (ROUTINE X 2)  HIV ANTIBODY (ROUTINE TESTING W REFLEX)  GLUCOSE, CAPILLARY  BLOOD GAS, ARTERIAL  CBC  BLOOD GAS, ARTERIAL  MAGNESIUM  PHOSPHORUS  TRIGLYCERIDES  CALCIUM, IONIZED  BLOOD GAS, ARTERIAL  FIBRINOGEN  CBC WITH DIFFERENTIAL/PLATELET  BASIC METABOLIC PANEL  MAGNESIUM  LACTIC ACID, PLASMA  TYPE AND SCREEN  ABO/RH  PREPARE FRESH FROZEN PLASMA  TROPONIN I (HIGH SENSITIVITY)    EKG EKG Interpretation  Date/Time:  Saturday October 27 2020 17:37:47 EST Ventricular Rate:  117 PR Interval:    QRS Duration: 86 QT Interval:  343 QTC Calculation: 479 R Axis:   25 Text Interpretation: Sinus tachycardia Borderline ST depression, lateral leads Borderline prolonged QT interval Confirmed by Quintella Reichert [phone]) on 11/11/2020 6:26:50 PM   Radiology CT Head Wo Contrast  Result Date: 10/22/2020 CLINICAL DATA:  Delirium, lethargy, found unresponsive EXAM: CT HEAD WITHOUT CONTRAST TECHNIQUE: Contiguous axial images were obtained from the base of the skull through the vertex without intravenous contrast. COMPARISON:  None. FINDINGS: Brain: No acute infarct or hemorrhage. Lateral ventricles and  midline structures are unremarkable. No acute extra-axial fluid collections. No mass effect. Vascular: No hyperdense vessel or unexpected calcification. Skull: Normal. Negative for fracture or focal lesion. Sinuses/Orbits: Mucosal thickening throughout the ethmoid air cells. No gas fluid levels. Other: None. IMPRESSION: 1. Ethmoid sinus disease. 2. Otherwise no acute intracranial process. Electronically Signed   By: Randa Ngo M.D.   On: 11/14/2020 19:23   DG CHEST PORT 1 VIEW  Result Date: 10/26/2020 CLINICAL DATA:  Central line placement, abnormal chest x-ray EXAM: PORTABLE CHEST 1 VIEW  COMPARISON:  11/15/2020 FINDINGS: Single frontal view of the chest demonstrates stable appearance of the endotracheal tube and enteric catheter. Left internal jugular catheter has been placed, tip overlying superior vena cava. The cardiac silhouette is stable. Widespread bilateral ground-glass airspace disease unchanged. No effusion or pneumothorax. IMPRESSION: 1. No complication after left internal jugular catheter placement. 2. Persistent widespread bilateral ground-glass airspace disease. Electronically Signed   By: Randa Ngo M.D.   On: 11/04/2020 22:42   DG Chest Portable 1 View  Result Date: 10/24/2020 CLINICAL DATA:  Intubated, liver disease EXAM: PORTABLE CHEST 1 VIEW COMPARISON:  None. FINDINGS: Two supine frontal views of the chest are obtained. Endotracheal tube overlies tracheal air column, tip approximately 2.5 cm above carina. Enteric catheter coiled over the gastric antrum. External defibrillator pads overlie the cardiac apex. Cardiac silhouette is unremarkable. Widespread bilateral ground-glass airspace disease could reflect widespread infection, edema, or ARDS. No effusion or pneumothorax. No acute bony abnormalities. IMPRESSION: 1. Support devices as above. 2. Widespread bilateral ground-glass airspace disease, which may reflect infection, edema, or ARDS. Electronically Signed   By: Randa Ngo M.D.   On: 10/30/2020 18:27    Procedures Procedure Name: Intubation Date/Time: 11/08/2020 6:30 PM Performed by: Quintella Reichert, MD Pre-anesthesia Checklist: Patient identified, Patient being monitored, Emergency Drugs available, Timeout performed and Suction available Oxygen Delivery Method: Non-rebreather mask Preoxygenation: Pre-oxygenation with 100% oxygen Induction Type: Rapid sequence Ventilation: Mask ventilation without difficulty Laryngoscope Size: Glidescope Number of attempts: 1 Placement Confirmation: ETT inserted through vocal cords under direct vision,  CO2 detector and  Breath sounds checked- equal and bilateral Tube secured with: ETT holder Difficulty Due To: Difficult Airway- due to limited oral opening       CRITICAL CARE Performed by: Quintella Reichert   Total critical care time: 45 minutes  Critical care time was exclusive of separately billable procedures and treating other patients.  Critical care was necessary to treat or prevent imminent or life-threatening deterioration.  Critical care was time spent personally by me on the following activities: development of treatment plan with patient and/or surrogate as well as nursing, discussions with consultants, evaluation of patient's response to treatment, examination of patient, obtaining history from patient or surrogate, ordering and performing treatments and interventions, ordering and review of laboratory studies, ordering and review of radiographic studies, pulse oximetry and re-evaluation of patient's condition.  Angiocath insertion Performed by: Quintella Reichert  Consent: Verbal consent obtained. Risks and benefits: risks, benefits and alternatives were discussed Time out: Immediately prior to procedure a "time out" was called to verify the correct patient, procedure, equipment, support staff and site/side marked as required.  Preparation: Patient was prepped and draped in the usual sterile fashion.  Vein Location: left hand  Gauge: 20  Normal blood return and flush without difficulty Patient tolerance: Patient tolerated the procedure well with no immediate complications.    Medications Ordered in ED Medications  octreotide (SANDOSTATIN) 2 mcg/mL load via infusion 50 mcg (50  mcg Intravenous Bolus from Bag 10/19/2020 1835)    And  octreotide (SANDOSTATIN) 500 mcg in sodium chloride 0.9 % 250 mL (2 mcg/mL) infusion (50 mcg/hr Intravenous IV Pump Association 10/31/2020 1937)  pantoprazole (PROTONIX) 80 mg in sodium chloride 0.9 % 100 mL (0.8 mg/mL) infusion (8 mg/hr Intravenous New Bag/Given  11/03/2020 1933)  pantoprazole (PROTONIX) injection 40 mg (has no administration in time range)  fentaNYL (SUBLIMAZE) injection 100 mcg (100 mcg Intravenous Given 10/23/2020 1822)  fentaNYL (SUBLIMAZE) 100 MCG/2ML injection (has no administration in time range)  docusate sodium (COLACE) capsule 100 mg (has no administration in time range)  polyethylene glycol (MIRALAX / GLYCOLAX) packet 17 g (has no administration in time range)  ondansetron (ZOFRAN) injection 4 mg (has no administration in time range)  0.9 %  sodium chloride infusion (has no administration in time range)  propofol (DIPRIVAN) 1000 MG/100ML infusion (has no administration in time range)  thiamine 537m in normal saline (512m IVPB (has no administration in time range)  lactated ringers bolus 1,000 mL ( Intravenous IV Pump Association 10/20/2020 1936)  piperacillin-tazobactam (ZOSYN) IVPB 3.375 g (has no administration in time range)  vancomycin (VANCOREADY) IVPB 1750 mg/350 mL (has no administration in time range)    Followed by  vancomycin (VANCOREADY) IVPB 1000 mg/200 mL (has no administration in time range)  0.9 %  sodium chloride infusion (Manually program via Guardrails IV Fluids) ( Intravenous Not Given 11/03/2020 2225)  Chlorhexidine Gluconate Cloth 2 % PADS 6 each (has no administration in time range)  lactulose (CHRONULAC) 10 GM/15ML solution 30 g (30 g Per Tube Given 11/06/2020 2303)  0.9 %  sodium chloride infusion (has no administration in time range)  dextrose 5 % in lactated ringers infusion (has no administration in time range)  0.9 %  sodium chloride infusion (has no administration in time range)  calcium chloride 10 % injection (  Not Given 11/09/2020 2210)  norepinephrine (LEVOPHED) 16 mg in 2506mremix infusion (has no administration in time range)  vasopressin (PITRESSIN) 20 Units in sodium chloride 0.9 % 100 mL infusion-*FOR SHOCK* (0.04 Units/min Intravenous IV Pump Association 10/29/2020 2152)  fentaNYL 2500m108mn NS 250mL65m0mcg43m infusion-PREMIX (50 mcg/hr Intravenous New Bag/Given 11/05/2020 2146)  fentaNYL (SUBLIMAZE) bolus via infusion 50 mcg (has no administration in time range)  chlorhexidine gluconate (MEDLINE KIT) (PERIDEX) 0.12 % solution 15 mL (15 mLs Mouth Rinse Given 10/26/2020 2301)  MEDLINE mouth rinse (has no administration in time range)  etomidate (AMIDATE) injection (20 mg Intravenous Given 10/21/2020 1745)  succinylcholine (ANECTINE) injection (100 mg Intravenous Given 11/04/2020 1745)  pantoprazole (PROTONIX) 80 mg in sodium chloride 0.9 % 100 mL IVPB (0 mg Intravenous Stopped 11/12/2020 1941)  sodium chloride 0.9 % bolus 1,000 mL (0 mLs Intravenous Stopped 10/25/2020 1942)  piperacillin-tazobactam (ZOSYN) IVPB 3.375 g (0 g Intravenous Stopped 11/09/2020 1941)  dextrose 50 % solution 25 mL (25 mLs Intravenous Given 10/21/2020 1820)  calcium gluconate 2 g/ 100 mL sodium chloride IVPB (2,000 mg Intravenous New Bag/Given 10/26/2020 2031)  sodium bicarbonate injection 100 mEq (100 mEq Intravenous Given 11/13/2020 1852)  fentaNYL (SUBLIMAZE) injection 100 mcg (100 mcg Intravenous Given 11/06/2020 1858)  sodium bicarbonate injection 50 mEq (50 mEq Intravenous Given 10/30/2020 2105)  lactated ringers bolus 1,000 mL (1,000 mLs Intravenous New Bag/Given 10/17/2020 2137)  calcium gluconate 1 g/ 50 mL sodium chloride IVPB (1,000 mg Intravenous New Bag/Given 10/20/2020 2148)  albumin human 25 % solution 50 g (50 g Intravenous New Bag/Given 10/19/2020 2130)  fentaNYL (SUBLIMAZE) injection 50 mcg (50 mcg Intravenous Given 11/03/2020 2200)  phytonadione (VITAMIN K) 5 mg in dextrose 5 % 50 mL IVPB (5 mg Intravenous New Bag/Given 10/20/2020 2219)    ED Course  I have reviewed the triage vital signs and the nursing notes.  Pertinent labs & imaging results that were available during my care of the patient were reviewed by me and considered in my medical decision making (see chart for details).    MDM Rules/Calculators/A&P                          Patient brought into the emergency department following witnessed arrest. History of alcohol abuse and recent discontinuation of alcohol use. On ED arrival the [facility] airway was exchanged for an endotracheal tube. Chest x-ray with significant infiltrates, concern for prehospital aspiration that possibly led to his arrest. He does have blood in his airway, unclear if this is due to trauma versus G.I. bleed. Only a small amount of blood was returned from his of G-tube. Will start Protonix, act reattach and antibiotics for possible aspiration and upper G.I. bleed. Plan to admit to critical care for ongoing resuscitation and treatment.  Final Clinical Impression(s) / ED Diagnoses Final diagnoses:  Respiratory arrest [facility])    Rx / [place] Orders ED Discharge Orders    None       Quintella Reichert, MD 11/07/2020 2324

## 2020-10-27 NOTE — Significant Event (Addendum)
Critical Care Note Asked to see pt bedside urgently for worsening hypotension with vasopressor requirements.    The patient was admitted earlier this evening after witnessed asystolic cardiac arrest with ROSC achieved after 13 minutes.    He has underlying history of alcoholism and liver cirrhosis.  He has OGT to suction with coffee ground content, and is on Octreotide and protonix drip.  His CXR shows severe widespread bilateral airspace disease concerning for ARDS.  CT head was unremarkable.  The patient isn't on sedation but yet not waking.   I arrived bedside immediately.   SBP 76 with MAP of 65 on Levo of . Levo increased steadily to , then Vaso 0.04U added.   Albumin 50gm ordered. Once Vaso on board, was able to steadily wean down Levo to now. Fentanyl bolus and drip started at , uptitrated to , then .   Discontinued 2nd liter LR which was more than half way done consider ARDS picture.   Canceled plans for 3 units FFP. Instead ordered Vitamin K 5mg  IV x 1. Will check fibrinogen and if < 120, will give Cryo. A-line already placed by RT. I placed left IJ triple lumen.  Blood pressure (!) 124/31, pulse (!) 112, temperature (!) 97.4 F (36.3 C), temperature source Oral, resp. rate (!) 36, height 5\' 4"  (1.626 m), weight 82.2 kg, SpO2 94 %.  Physical: Gen:  Unresponsive, on mechanical vent HEENT:  NCAT, ETT in place Chest:  Coarse crackles bilaterally CVS:  Tachy, regular rhythm Abd:  Soft, nondistended  A/P 1. Asystolic cardiac arrest 2. Shock, likely mixed septic and hypovolemic (EF looks good on bedside echo 3. Acute hypoxic respiratory failure due to ARDS 4. Acute encephalopathy, concern for anoxic brain injury 5. Upper GI bleed 6. Decompensated liver cirrhosis 7. Coagulopathy due to liver cirrhosis  8. Acute kidney injury 9. Severe lactic acidosis due to septic shock/cardiac arrest 10. Alcoholism   - Levophed, vasopressin:  Wean  levophed as tolerated, maintain MAP > 65 - continue empiric antibiotics -- on Vanc and Zosyn - Lung protective mechanical ventilation -- reduced Vt from 500 to 450, Pplat 21 - continue protonix drip and Octreotide - serial H/H, coags, lactic acid - check fibrinogen, give Cryo if < 120 - Transfuse pRBC for hgb < 7 - avoid FFP to avoid volume overload - continue Fentanyl  - Initial propofol when able to tolerate - formal ECHO - MVI, folic acid IV daily - Wernicke's dosing thiamine 500mg  IV q 8 hrs x 6 doses  Updated family bedside and again in the waiting area.   55 minutes critical care time.   ___________________  , MD [facility]

## 2020-10-27 NOTE — Progress Notes (Signed)
   11-05-2020 1855  Clinical Encounter Type  Visited With Patient and family together  Visit Type ED  Referral From Nurse  Consult/Referral To Chaplain   Deloris stated family was in Consult Rm A. no family in there. Deloris advised patient was taken to [facility]. Provided ministry of presence and comfort to Mrs. Kerney Elbe and her mother-in-law in the waiting area while lab and echo team was in patient's room. PM chaplain relieved and continued providing ministry to Mrs.  This note was prepared by Deneen Harts, M.Div..  For questions please contact  me by phone [phone].

## 2020-10-27 NOTE — Progress Notes (Signed)
2 amps sodium bicarb given at 2100 per Dr. Benjamin Stain Pola Corn). Family at bedside, MD explained situation and grave prognosis to them via video chat.    CCM Ground team now at bedside 9:08 PM

## 2020-10-27 NOTE — Progress Notes (Signed)
eLink Physician-Brief Progress Note Patient Name: Logan Shaw DOB: March 05, 1976 MRN: [id number]   Date of Service  November 05, 2020  HPI/Events of Note  Called to room for severe hypotension (MAP in 40s) despite levophed running at 10 mcg/minute peripherally.  Patient appears extremely air-hungry, pulling tidal volumes well over the set/target volume of 400 (and even over an increased set/target of ). Breathing in 40s over set rate of 34/min.   eICU Interventions  Strongly suspect worsening metabolic/lactic acidosis, +/- contribution from V/Q mismatch impairing CO2 exchange.  STAT ABG.  Give 2 amps of NaHCO3 and 1g calcium gluconate IV.  Switch levophed to intraosseous line and increase rate to target MAP 65.  Bedside team notified immediately of the patient's clinical deterioration and has now arrived at bedside to assume care.     Intervention Category Major Interventions: Hypotension - evaluation and management;Respiratory failure - evaluation and management;Acid-Base disturbance - evaluation and management  Janae Bridgeman November 05, 2020, 9:10 PM

## 2020-10-27 NOTE — H&P (Signed)
NAME:  Logan Shaw, MRN:  [id number], DOB:  1976/03/30, LOS: 0 ADMISSION DATE:  11/24/20, CONSULTATION DATE: 11-24-2020 REFERRING MD: Olivia Canter, CHIEF COMPLAINT: Status post cardiac arrest  Brief History:  45 year old male with with alcohol abuse who presented status post cardiac arrest, witnessed by his wife and bystander, ROSC achieved after 12 minutes.  Now in ARDS and septic shock  History of Present Illness:  45 year old male with history of alcohol abuse, alcoholic liver cirrhosis and prior history of GI bleeding who presented status post cardiac arrest this evening. Most of the history is taken from medical record as patient is intubated, unresponsive and sedated. As per patient's wife he was in his usual state of health until about 2 days ago when he started feeling generalized weakness, he was seen at GI clinic for elevated LFTs, he was not feeling well whole day today, later on wife checked on him he was found unresponsive, they started CPR and EMS was summoned, ROSC was achieved after 12 minutes of CPR.  King's airway was placed and patient was brought into the emergency department. In the emergency department King's airway was changed to ET tube, patient was noted to have bleeding from ETT and OGT.  Past Medical History:   Past Medical History:  Diagnosis Date  . Liver disease, chronic, with cirrhosis (HCC)      Significant Hospital Events:  Admit 3/12  Consults:  PCCM  Procedures:  ETT 3/12 >  Significant Diagnostic Tests:  X-ray chest: Reviewed by myself showing bilateral airspace disease consistent with ARDS\ 3/12 CT head: Pending  Micro Data:    Antimicrobials:  Vancomycin 3/12 >> Zosyn 3/12 >>  Interim History / Subjective:    Objective   Blood pressure (!) 121/91, pulse (!) 117, resp. rate 14, height 5\' 4"  (1.626 m), weight 82.2 kg, SpO2 (!) 88 %.    Vent Mode: PRVC FiO2 (%):  [100 %] 100 % Set Rate:  [18 bmp] 18 bmp Vt Set:  [580 mL] 580  mL PEEP:  [10 cmH20] 10 cmH20 Plateau Pressure:  [28 cmH20] 28 cmH20  No intake or output data in the 24 hours ending 11/24/20 1844 Filed Weights   2020/11/24 1800  Weight: 82.2 kg    Examination:   Physical exam: General: Acutely ill-appearing male, orally intubated HEENT: [place]/AT, eyes anicteric.  ETT and OGT in place, bleeding noted through ETT and OGT Neuro: Sedated, not following commands.  Eyes are closed.  Pupils 3 mm bilateral non- reactive to light Chest: Bilateral crackles all over, no wheezes Heart: Regular rate and rhythm, no murmurs or gallops Abdomen: Soft, nontender, nondistended, bowel sounds present Skin: No rash   Resolved Hospital Problem list     Assessment & Plan:  Status post cardiac arrest likely due to hypoxia Acute hypoxic respiratory failure due to ARDS Septic shock due to bilateral pneumonia Upper GI bleeding Alcoholic liver cirrhosis Acute metabolic acidosis Hypothermia Hyponatremia Acute kidney injury Hypocalcemia Hypoglycemia Coagulopathy with elevated INR of 3.8  Continue to maintain normothermia Prevent fever Continue lung protective ventilation with low tidal volume, elevated PEEP per ARDS net protocol Started on IV vancomycin and Zosyn Continue IV fluid Trend lactate Follow-up cultures Continue octreotide and Protonix Monitor H&H Patient was given 2 Amps of bicarbonate, trend ABGs Continue Bair hugger Monitor electrolytes and supplement Patient was found to be hypoglycemic with fingerstick 61, he was given D50 x1 Continue thiamine Continue monitor CBG every hour Continue Levophed to maintain MAP of 65 FFP's Monitor INR  GI consult   Best practice (evaluated daily)  Diet: NPO Pain/Anxiety/Delirium protocol (if indicated): Propofol/as needed fentanyl VAP protocol (if indicated): Ordered DVT prophylaxis: SCDs GI prophylaxis: Protonix Glucose control: CBG monitoring Mobility: Bedrest Disposition: ICU  Goals of Care:  Last  date of multidisciplinary goals of care discussion: Pending Family and staff present:  Summary of discussion:  Follow up goals of care discussion due: 3/13 Code Status: Full code  Labs   CBC: Recent Labs  Lab 25-Nov-2020 1746 25-Nov-2020 1756  WBC 12.8*  --   HGB 13.0 14.3  13.9  HCT 38.9* 42.0  41.0  MCV 115.1*  --   PLT 67*  --     Basic Metabolic Panel: Recent Labs  Lab 11/25/2020 1756  NA 124*  125*  K 4.7  4.7  CL 96*  GLUCOSE 61*  BUN 27*  CREATININE 1.20   GFR: Estimated Creatinine Clearance: 77.6 mL/min (by C-G formula based on SCr of 1.2 mg/dL). Recent Labs  Lab 11/25/20 1746  WBC 12.8*    Liver Function Tests: No results for input(s): AST, ALT, ALKPHOS, BILITOT, PROT, ALBUMIN in the last 168 hours. No results for input(s): LIPASE, AMYLASE in the last 168 hours. No results for input(s): AMMONIA in the last 168 hours.  ABG    Component Value Date/Time   HCO3 12.0 (L) 11/25/20 1756   TCO2 15 (L) 11/25/20 1756   TCO2 13 (L) 11/25/20 1756   ACIDBASEDEF 15.0 (H) 25-Nov-2020 1756   O2SAT 97.0 11-25-20 1756     Coagulation Profile: Recent Labs  Lab 11-25-20 1746  INR 3.8*    Cardiac Enzymes: No results for input(s): CKTOTAL, CKMB, CKMBINDEX, TROPONINI in the last 168 hours.  HbA1C: No results found for: HGBA1C  CBG: Recent Labs  Lab 2020-11-25 1804 Nov 25, 2020 1838  GLUCAP 66* 101*    Review of Systems:   Unable to obtain as patient is intubated and sedated  Past Medical History:  He,  has a past medical history of Liver disease, chronic, with cirrhosis (HCC).   Surgical History:  Unable to obtain as patient intubated and sedated  Social History:    Drinks alcohol 6-7 beers a day  Family History:  His family history is not on file.   Allergies No Known Allergies   Home Medications  Prior to Admission medications   Medication Sig Start Date End Date Taking? Authorizing Provider  aspirin-acetaminophen-caffeine (EXCEDRIN  MIGRAINE) [id number] MG tablet Take 2 tablets by mouth every 6 (six) hours as needed for headache.   Yes [provider]  dicyclomine (BENTYL) 10 MG capsule Take 10 mg by mouth 2 (two) times daily. 10/22/20  Yes [provider]  fluticasone (FLONASE) 50 MCG/ACT nasal spray Place 1 spray into both nostrils at bedtime as needed for allergies or rhinitis.   Yes [provider]  loperamide (IMODIUM) 2 MG capsule Take 2-4 mg by mouth See admin instructions. Take 2 capsules (4 mg) by mouth at 1st loose stool, diarrhea, then take 1 capsule (2 mg) after each subsequent loose stool, diarrhea   Yes [provider]  phenylephrine-shark liver oil-mineral oil-petrolatum (PREPARATION H) 0.25-14-74.9 % rectal ointment Place 1 application rectally 2 (two) times daily as needed for hemorrhoids.   Yes [provider]     Critical care time: 40     Total critical care time: 40 minutes  Performed by: Cheri Fowler   Critical care time was exclusive of separately billable procedures and treating other patients.  Critical care was necessary to treat or prevent imminent or life-threatening deterioration.   Critical care was time spent personally by me on the following activities: development of treatment plan with patient and/or surrogate as well as nursing, discussions with consultants, evaluation of patient's response to treatment, examination of patient, obtaining history from patient or surrogate, ordering and performing treatments and interventions, ordering and review of laboratory studies, ordering and review of radiographic studies, pulse oximetry and re-evaluation of patient's condition.   Cheri Fowler MD [facility] See Amion for pager If no response to pager, please call [phone] until 7pm After 7pm, Please call E-link [phone]

## 2020-10-27 NOTE — Progress Notes (Signed)
Pt transported from ED trauma B to CT and then to 2H12 with no complications.

## 2020-10-27 NOTE — Procedures (Signed)
Central Venous Catheter Insertion Procedure Note  Logan Shaw  [id number]  1976/03/27  Date:2020-11-25  Time:11:41 PM   Provider Performing:Jimi Vita Barley   Procedure: Insertion of Non-tunneled Central Venous [phone]) with US guidance ([id number])   Indication(s) Medication administration  Consent Risks of the procedure as well as the alternatives and risks of each were explained to the patient and/or caregiver.  Consent for the procedure was obtained and is signed in the bedside chart  Anesthesia Topical only with 1% lidocaine   Timeout Verified patient identification, verified procedure, site/side was marked, verified correct patient position, special equipment/implants available, medications/allergies/relevant history reviewed, required imaging and test results available.  Sterile Technique Maximal sterile technique including full sterile barrier drape, hand hygiene, sterile gown, sterile gloves, mask, hair covering, sterile ultrasound probe cover (if used).  Procedure Description Area of catheter insertion was cleaned with chlorhexidine and draped in sterile fashion.  With real-time ultrasound guidance a central venous catheter was placed into the left internal jugular vein. Nonpulsatile blood flow and easy flushing noted in all ports.  The catheter was sutured in place and sterile dressing applied.  Complications/Tolerance None; patient tolerated the procedure well. Chest X-ray is ordered to verify placement for internal jugular or subclavian cannulation.   Chest x-ray is not ordered for femoral cannulation.  EBL Minimal  Specimen(s) None  ___________________ Logan Stager. Coralie Common, MD [facility]

## 2020-10-27 NOTE — Progress Notes (Signed)
RT at bedside to insert arterial line d/t ongoing hypotension w/increasing pressor requirement, only transiently responsive to fluid bolus.

## 2020-10-27 NOTE — ED Triage Notes (Signed)
Pt BIB [facility] from home, wife reports pt has been lethargic and feeling unwell all day. Wife later found pt unresponsive, on EMS arrival, pt in asystole, CPR started, 2 epi given, 2g mag given. ROSC after 13 minutes cpr. Bounding pulses on arrival, blood in the king airway. Hx liver disease and alcoholism.

## 2020-10-28 ENCOUNTER — Inpatient Hospital Stay (HOSPITAL_COMMUNITY): Payer: 59

## 2020-10-28 DIAGNOSIS — I469 Cardiac arrest, cause unspecified: Secondary | ICD-10-CM

## 2020-10-28 DIAGNOSIS — G9341 Metabolic encephalopathy: Secondary | ICD-10-CM

## 2020-10-28 LAB — GLOBAL TEG PANEL
CFF Max Amplitude: 6.1 mm — ABNORMAL LOW (ref 15–32)
CK with Heparinase (R): 7.6 min (ref 4.3–8.3)
Citrated Functional Fibrinogen: 158.2 mg/dL — ABNORMAL LOW (ref 278–581)
Citrated Kaolin (K): 3.1 min — ABNORMAL HIGH (ref 0.8–2.1)
Citrated Kaolin (MA): 41.5 mm — ABNORMAL LOW (ref 52–69)
Citrated Kaolin (R): 7.3 min (ref 4.6–9.1)
Citrated Kaolin Angle: 63 deg (ref 63–78)
Citrated Rapid TEG (MA): <40 mm — ABNORMAL LOW (ref 52–70)

## 2020-10-28 LAB — COMPREHENSIVE METABOLIC PANEL
ALT: 291 U/L — ABNORMAL HIGH (ref 0–44)
ALT: 471 U/L — ABNORMAL HIGH (ref 0–44)
AST: 1142 U/L — ABNORMAL HIGH (ref 15–41)
AST: 1905 U/L — ABNORMAL HIGH (ref 15–41)
Albumin: 2.1 g/dL — ABNORMAL LOW (ref 3.5–5.0)
Albumin: 2.3 g/dL — ABNORMAL LOW (ref 3.5–5.0)
Alkaline Phosphatase: 62 U/L (ref 38–126)
Alkaline Phosphatase: 58 U/L (ref 38–126)
Anion gap: 24 — ABNORMAL HIGH (ref 5–15)
Anion gap: 21 — ABNORMAL HIGH (ref 5–15)
BUN: 20 mg/dL (ref 6–20)
BUN: 21 mg/dL — ABNORMAL HIGH (ref 6–20)
CO2: 17 mmol/L — ABNORMAL LOW (ref 22–32)
CO2: 18 mmol/L — ABNORMAL LOW (ref 22–32)
Calcium: 8.2 mg/dL — ABNORMAL LOW (ref 8.9–10.3)
Calcium: 8 mg/dL — ABNORMAL LOW (ref 8.9–10.3)
Chloride: 91 mmol/L — ABNORMAL LOW (ref 98–111)
Chloride: 93 mmol/L — ABNORMAL LOW (ref 98–111)
Creatinine, Ser: 1.64 mg/dL — ABNORMAL HIGH (ref 0.61–1.24)
Creatinine, Ser: 1.74 mg/dL — ABNORMAL HIGH (ref 0.61–1.24)
GFR, Estimated: 53 mL/min — ABNORMAL LOW (ref >60)
GFR, Estimated: 50 mL/min — ABNORMAL LOW (ref >60)
Glucose, Bld: 92 mg/dL (ref 70–99)
Glucose, Bld: 74 mg/dL (ref 70–99)
Potassium: 3.5 mmol/L (ref 3.5–5.1)
Potassium: 4.4 mmol/L (ref 3.5–5.1)
Sodium: 132 mmol/L — ABNORMAL LOW (ref 135–145)
Sodium: 132 mmol/L — ABNORMAL LOW (ref 135–145)
Total Bilirubin: 10.5 mg/dL — ABNORMAL HIGH (ref 0.3–1.2)
Total Bilirubin: 11.3 mg/dL — ABNORMAL HIGH (ref 0.3–1.2)
Total Protein: 6.1 g/dL — ABNORMAL LOW (ref 6.5–8.1)
Total Protein: 6.5 g/dL (ref 6.5–8.1)

## 2020-10-28 LAB — POCT I-STAT 7, (LYTES, BLD GAS, ICA,H+H)
Acid-base deficit: 9 mmol/L — ABNORMAL HIGH (ref 0.0–2.0)
Acid-base deficit: 6 mmol/L — ABNORMAL HIGH (ref 0.0–2.0)
Acid-base deficit: 5 mmol/L — ABNORMAL HIGH (ref 0.0–2.0)
Bicarbonate: 15.4 mmol/L — ABNORMAL LOW (ref 20.0–28.0)
Bicarbonate: 18.8 mmol/L — ABNORMAL LOW (ref 20.0–28.0)
Bicarbonate: 21.8 mmol/L (ref 20.0–28.0)
Calcium, Ion: 0.94 mmol/L — ABNORMAL LOW (ref 1.15–1.40)
Calcium, Ion: 0.96 mmol/L — ABNORMAL LOW (ref 1.15–1.40)
Calcium, Ion: 0.98 mmol/L — ABNORMAL LOW (ref 1.15–1.40)
HCT: 31 % — ABNORMAL LOW (ref 39.0–52.0)
HCT: 33 % — ABNORMAL LOW (ref 39.0–52.0)
HCT: 33 % — ABNORMAL LOW (ref 39.0–52.0)
Hemoglobin: 10.5 g/dL — ABNORMAL LOW (ref 13.0–17.0)
Hemoglobin: 11.2 g/dL — ABNORMAL LOW (ref 13.0–17.0)
Hemoglobin: 11.2 g/dL — ABNORMAL LOW (ref 13.0–17.0)
O2 Saturation: 89 %
O2 Saturation: 95 %
O2 Saturation: 96 %
Patient temperature: 98.2
Patient temperature: 98.7
Patient temperature: 37.4
Potassium: 3.1 mmol/L — ABNORMAL LOW (ref 3.5–5.1)
Potassium: 5.5 mmol/L — ABNORMAL HIGH (ref 3.5–5.1)
Potassium: 4.6 mmol/L (ref 3.5–5.1)
Sodium: 134 mmol/L — ABNORMAL LOW (ref 135–145)
Sodium: 129 mmol/L — ABNORMAL LOW (ref 135–145)
Sodium: 134 mmol/L — ABNORMAL LOW (ref 135–145)
TCO2: 16 mmol/L — ABNORMAL LOW (ref 22–32)
TCO2: 20 mmol/L — ABNORMAL LOW (ref 22–32)
TCO2: 23 mmol/L (ref 22–32)
pCO2 arterial: 26.3 mmHg — ABNORMAL LOW (ref 32.0–48.0)
pCO2 arterial: 33.2 mmHg (ref 32.0–48.0)
pCO2 arterial: 48.3 mmHg — ABNORMAL HIGH (ref 32.0–48.0)
pH, Arterial: 7.373 (ref 7.350–7.450)
pH, Arterial: 7.362 (ref 7.350–7.450)
pH, Arterial: 7.265 — ABNORMAL LOW (ref 7.350–7.450)
pO2, Arterial: 55 mmHg — ABNORMAL LOW (ref 83.0–108.0)
pO2, Arterial: 76 mmHg — ABNORMAL LOW (ref 83.0–108.0)
pO2, Arterial: 96 mmHg (ref 83.0–108.0)

## 2020-10-28 LAB — TROPONIN I (HIGH SENSITIVITY): Troponin I (High Sensitivity): 138 ng/L (ref <18)

## 2020-10-28 LAB — ECHOCARDIOGRAM COMPLETE
AR max vel: 3.57 cm2
AV Area VTI: 3.29 cm2
AV Area mean vel: 3.07 cm2
AV Mean grad: 11 mmHg
AV Peak grad: 19.3 mmHg
Ao pk vel: 2.2 m/s
Area-P 1/2: 10.99 cm2
Height: 64 in
S' Lateral: 2.9 cm
Weight: 2959.46 oz

## 2020-10-28 LAB — CBC WITH DIFFERENTIAL/PLATELET
Abs Immature Granulocytes: 0.32 10*3/uL — ABNORMAL HIGH (ref 0.00–0.07)
Basophils Absolute: 0.1 10*3/uL (ref 0.0–0.1)
Basophils Relative: 1 %
Eosinophils Absolute: 0 10*3/uL (ref 0.0–0.5)
Eosinophils Relative: 0 %
HCT: 30.5 % — ABNORMAL LOW (ref 39.0–52.0)
Hemoglobin: 10.2 g/dL — ABNORMAL LOW (ref 13.0–17.0)
Immature Granulocytes: 2 %
Lymphocytes Relative: 8 %
Lymphs Abs: 1.1 10*3/uL (ref 0.7–4.0)
MCH: 38.8 pg — ABNORMAL HIGH (ref 26.0–34.0)
MCHC: 33.4 g/dL (ref 30.0–36.0)
MCV: 116 fL — ABNORMAL HIGH (ref 80.0–100.0)
Monocytes Absolute: 1.2 10*3/uL — ABNORMAL HIGH (ref 0.1–1.0)
Monocytes Relative: 9 %
Neutro Abs: 11.4 10*3/uL — ABNORMAL HIGH (ref 1.7–7.7)
Neutrophils Relative %: 80 %
Platelets: 47 10*3/uL — ABNORMAL LOW (ref 150–400)
RBC: 2.63 MIL/uL — ABNORMAL LOW (ref 4.22–5.81)
RDW: 18.1 % — ABNORMAL HIGH (ref 11.5–15.5)
WBC: 14.1 10*3/uL — ABNORMAL HIGH (ref 4.0–10.5)
nRBC: 1 % — ABNORMAL HIGH (ref 0.0–0.2)

## 2020-10-28 LAB — DIC (DISSEMINATED INTRAVASCULAR COAGULATION)PANEL
D-Dimer, Quant: >20 ug/mL-FEU — ABNORMAL HIGH (ref 0.00–0.50)
D-Dimer, Quant: >20 ug/mL-FEU — ABNORMAL HIGH (ref 0.00–0.50)
Fibrinogen: 87 mg/dL — CL (ref 210–475)
Fibrinogen: 150 mg/dL — ABNORMAL LOW (ref 210–475)
INR: 6.3 (ref 0.8–1.2)
INR: 3.4 — ABNORMAL HIGH (ref 0.8–1.2)
Platelets: 50 10*3/uL — ABNORMAL LOW (ref 150–400)
Platelets: 49 10*3/uL — ABNORMAL LOW (ref 150–400)
Prothrombin Time: 53.6 seconds — ABNORMAL HIGH (ref 11.4–15.2)
Prothrombin Time: 33 seconds — ABNORMAL HIGH (ref 11.4–15.2)
Smear Review: NONE SEEN
Smear Review: NONE SEEN
aPTT: 56 seconds — ABNORMAL HIGH (ref 24–36)
aPTT: 44 seconds — ABNORMAL HIGH (ref 24–36)

## 2020-10-28 LAB — CBC
HCT: 29.7 % — ABNORMAL LOW (ref 39.0–52.0)
HCT: 30.3 % — ABNORMAL LOW (ref 39.0–52.0)
Hemoglobin: 10.7 g/dL — ABNORMAL LOW (ref 13.0–17.0)
Hemoglobin: 10.6 g/dL — ABNORMAL LOW (ref 13.0–17.0)
MCH: 39.8 pg — ABNORMAL HIGH (ref 26.0–34.0)
MCH: 38.8 pg — ABNORMAL HIGH (ref 26.0–34.0)
MCHC: 36 g/dL (ref 30.0–36.0)
MCHC: 35 g/dL (ref 30.0–36.0)
MCV: 110.4 fL — ABNORMAL HIGH (ref 80.0–100.0)
MCV: 111 fL — ABNORMAL HIGH (ref 80.0–100.0)
Platelets: 44 10*3/uL — ABNORMAL LOW (ref 150–400)
Platelets: 48 10*3/uL — ABNORMAL LOW (ref 150–400)
RBC: 2.69 MIL/uL — ABNORMAL LOW (ref 4.22–5.81)
RBC: 2.73 MIL/uL — ABNORMAL LOW (ref 4.22–5.81)
RDW: 17.1 % — ABNORMAL HIGH (ref 11.5–15.5)
RDW: 17.2 % — ABNORMAL HIGH (ref 11.5–15.5)
WBC: 7.5 10*3/uL (ref 4.0–10.5)
WBC: 9.7 10*3/uL (ref 4.0–10.5)
nRBC: 1.1 % — ABNORMAL HIGH (ref 0.0–0.2)
nRBC: 0.6 % — ABNORMAL HIGH (ref 0.0–0.2)

## 2020-10-28 LAB — BPAM FFP
Blood Product Expiration Date: 202203132359
Blood Product Expiration Date: 202203132359
Blood Product Expiration Date: 202203132359
ISSUE DATE / TIME: 202203122126
Unit Type and Rh: 600
Unit Type and Rh: 600
Unit Type and Rh: 6200

## 2020-10-28 LAB — GLUCOSE, CAPILLARY
Glucose-Capillary: 100 mg/dL — ABNORMAL HIGH (ref 70–99)
Glucose-Capillary: 72 mg/dL (ref 70–99)
Glucose-Capillary: 74 mg/dL (ref 70–99)
Glucose-Capillary: 78 mg/dL (ref 70–99)
Glucose-Capillary: 80 mg/dL (ref 70–99)
Glucose-Capillary: 81 mg/dL (ref 70–99)

## 2020-10-28 LAB — PREPARE FRESH FROZEN PLASMA

## 2020-10-28 LAB — LACTIC ACID, PLASMA
Lactic Acid, Venous: >11 mmol/L (ref 0.5–1.9)
Lactic Acid, Venous: >11 mmol/L (ref 0.5–1.9)
Lactic Acid, Venous: >11 mmol/L (ref 0.5–1.9)

## 2020-10-28 LAB — TRIGLYCERIDES: Triglycerides: 135 mg/dL (ref <150)

## 2020-10-28 LAB — MAGNESIUM: Magnesium: 2.1 mg/dL (ref 1.7–2.4)

## 2020-10-28 LAB — PHOSPHORUS: Phosphorus: 4.5 mg/dL (ref 2.5–4.6)

## 2020-10-28 MED ORDER — PHENYLEPHRINE 40 MCG/ML (10ML) SYRINGE FOR IV PUSH (FOR BLOOD PRESSURE SUPPORT)
PREFILLED_SYRINGE | INTRAVENOUS | Status: AC
  Filled 2020-10-28: qty 10

## 2020-10-28 MED ORDER — HYDROCORTISONE NA SUCCINATE PF 100 MG IJ SOLR
50.0000 mg | Freq: Four times a day (QID) | INTRAMUSCULAR | Status: DC
  Administered 2020-10-28 – 2020-10-30 (×8): 50 mg via INTRAVENOUS
  Filled 2020-10-28 (×8): qty 2

## 2020-10-28 MED ORDER — SODIUM CHLORIDE 0.9 % IV SOLN
4000.0000 mg | Freq: Once | INTRAVENOUS | Status: AC
  Administered 2020-10-28: 4000 mg via INTRAVENOUS
  Filled 2020-10-28: qty 40

## 2020-10-28 MED ORDER — DEXTROSE 50 % IV SOLN
12.5000 g | INTRAVENOUS | Status: AC
  Administered 2020-10-28: 12.5 g via INTRAVENOUS
  Filled 2020-10-28: qty 50

## 2020-10-28 MED ORDER — LEVETIRACETAM IN NACL 1000 MG/100ML IV SOLN
1000.0000 mg | Freq: Two times a day (BID) | INTRAVENOUS | Status: DC
  Administered 2020-10-29 – 2020-11-01 (×7): 1000 mg via INTRAVENOUS
  Filled 2020-10-28 (×7): qty 100

## 2020-10-28 MED ORDER — MIDAZOLAM 50MG/50ML (1MG/ML) PREMIX INFUSION
0.5000 mg/h | INTRAVENOUS | Status: DC
  Administered 2020-10-28: 4.2 mg/h via INTRAVENOUS
  Administered 2020-10-28: 1 mg/h via INTRAVENOUS
  Filled 2020-10-28 (×2): qty 50

## 2020-10-28 MED ORDER — SODIUM CHLORIDE 0.9% IV SOLUTION: Freq: Once | INTRAVENOUS | Status: AC

## 2020-10-28 MED ORDER — POTASSIUM CHLORIDE 10 MEQ/50ML IV SOLN
10.0000 meq | INTRAVENOUS | Status: AC
  Administered 2020-10-28 (×4): 10 meq via INTRAVENOUS
  Filled 2020-10-28 (×4): qty 50

## 2020-10-28 MED ORDER — SODIUM CHLORIDE 0.9 % IV SOLN
20.0000 ug | Freq: Once | INTRAVENOUS | Status: AC
  Administered 2020-10-28: 20 ug via INTRAVENOUS
  Filled 2020-10-28: qty 5

## 2020-10-28 MED ORDER — NICOTINE 21 MG/24HR TD PT24
21.0000 mg | MEDICATED_PATCH | Freq: Every day | TRANSDERMAL | Status: DC
  Administered 2020-10-28 – 2020-11-01 (×5): 21 mg via TRANSDERMAL
  Filled 2020-10-28 (×6): qty 1

## 2020-10-28 MED ORDER — CALCIUM GLUCONATE 10 % IV SOLN: 1.0000 g | Freq: Once | INTRAVENOUS | Status: DC

## 2020-10-28 MED ORDER — VECURONIUM BROMIDE 10 MG IV SOLR
0.1000 mg/kg | INTRAVENOUS | Status: DC | PRN
  Administered 2020-10-28 (×2): 8.4 mg via INTRAVENOUS
  Filled 2020-10-28 (×2): qty 10

## 2020-10-28 MED ORDER — POTASSIUM CHLORIDE 20 MEQ PO PACK
20.0000 meq | PACK | ORAL | Status: AC
  Administered 2020-10-28 (×2): 20 meq
  Filled 2020-10-28 (×2): qty 1

## 2020-10-28 MED ORDER — CALCIUM GLUCONATE-NACL 1-0.675 GM/50ML-% IV SOLN
1.0000 g | Freq: Once | INTRAVENOUS | Status: AC
  Administered 2020-10-29: 1000 mg via INTRAVENOUS
  Filled 2020-10-28: qty 50

## 2020-10-28 MED ORDER — SODIUM CHLORIDE 0.9% IV SOLUTION: Freq: Once | INTRAVENOUS | Status: DC

## 2020-10-28 MED ORDER — ARTIFICIAL TEARS OPHTHALMIC OINT
1.0000 "application " | TOPICAL_OINTMENT | Freq: Three times a day (TID) | OPHTHALMIC | Status: DC
  Administered 2020-10-28 – 2020-11-01 (×14): 1 via OPHTHALMIC
  Filled 2020-10-28 (×3): qty 3.5

## 2020-10-28 MED ORDER — SODIUM CHLORIDE 0.9% FLUSH
10.0000 mL | INTRAVENOUS | Status: DC | PRN
Start: 2020-10-28 — End: 2020-11-01

## 2020-10-28 MED ORDER — SODIUM CHLORIDE 0.9% FLUSH
10.0000 mL | Freq: Two times a day (BID) | INTRAVENOUS | Status: DC
  Administered 2020-10-28 – 2020-10-30 (×7): 10 mL
  Administered 2020-10-31: 20 mL
  Administered 2020-10-31 – 2020-11-01 (×2): 10 mL

## 2020-10-28 MED ORDER — SODIUM CHLORIDE 0.9 % IV SOLN
1.2500 ng/kg/min | INTRAVENOUS | Status: DC
  Administered 2020-10-28: 5 ng/kg/min via INTRAVENOUS
  Administered 2020-10-30: 20 ng/kg/min via INTRAVENOUS
  Filled 2020-10-28 (×3): qty 1

## 2020-10-28 MED ORDER — MIDAZOLAM BOLUS VIA INFUSION
0.1000 mg/kg | Freq: Once | INTRAVENOUS | Status: AC
  Administered 2020-10-28: 8.4 mg via INTRAVENOUS
  Filled 2020-10-28: qty 9

## 2020-10-28 MED ORDER — SODIUM BICARBONATE 8.4 % IV SOLN
50.0000 meq | Freq: Once | INTRAVENOUS | Status: AC
  Administered 2020-10-28: 50 meq via INTRAVENOUS

## 2020-10-28 NOTE — Progress Notes (Signed)
eLink Physician-Brief Progress Note Patient Name: Logan Shaw DOB: [birth year] MRN: [id number]   Date of Service  10/28/2020  HPI/Events of Note  Patient has MAP 55-59 on levo 50 mcg/min. Mildly acidemic. Low iCal.   eICU Interventions  Give 1g calcium gluconate. Give 1 amp NaHCO3.  Increased levophed upper limit to 60 mcg/min.  Ordered Giapreza.     Intervention Category Major Interventions: Hypotension - evaluation and management  Marveen Reeks Olar Santini 10/28/2020, 11:06 PM

## 2020-10-28 NOTE — Progress Notes (Signed)
Slow hemodynamic deterioration throughout day. Bleeding has eased up with FFP and cryoprecipitate. Will send repeat round of labs around 9PM. Prognosis continues to get more grim, relayed to wife at bedside, she needs time to think about code status.  Myrla Halsted MD PCCM

## 2020-10-28 NOTE — H&P (Addendum)
This is a progress note   NAME:  Logan Shaw, MRN:  [id number], DOB:  [birth year], LOS: 1 ADMISSION DATE:  11-14-2020, CONSULTATION DATE: Nov 14, 2020 REFERRING MD: Olivia Canter, CHIEF COMPLAINT: Status post cardiac arrest  Brief History:  45 year old male with with alcohol abuse, recently diagnosed alcholic liver disease question cirrhosis who presented status post cardiac arrest, witnessed by his wife and bystander, ROSC achieved after 12 minutes.  Now in ARDS and septic shock.  History of Present Illness:  45 year old male with history of alcohol abuse, alcoholic liver cirrhosis and prior history of GI bleeding who presented status post cardiac arrest this evening. Most of the history is taken from medical record as patient is intubated, unresponsive and sedated. As per patient's wife he was in his usual state of health until about 2 days ago when he started feeling generalized weakness, he was seen at GI clinic for elevated LFTs, he was not feeling well whole day today, later on wife checked on him he was found unresponsive, they started CPR and EMS was summoned, ROSC was achieved after 12 minutes of CPR.  King's airway was placed and patient was brought into the emergency department. In the emergency department King's airway was changed to ET tube, patient was noted to have bleeding from ETT and OGT.  Past Medical History:   Past Medical History:  Diagnosis Date  . Liver disease, chronic, with cirrhosis (HCC)      Significant Hospital Events:  Admit 3/12  Consults:  PCCM  Procedures:  ETT 3/12 >  Significant Diagnostic Tests:  X-ray chest: Reviewed by myself showing bilateral airspace disease consistent with ARDS\ 3/12 CT head: Pending  Micro Data:    Antimicrobials:  Vancomycin 3/12 >> Zosyn 3/12 >>  Interim History / Subjective:  No events, Continues to ooze from multiple sites from ongoing coagulopathy Remains poorly responsive On multiple vasopressors  Objective    Blood pressure (!) 148/38, pulse (!) 116, temperature (!) 97.1 F (36.2 C), temperature source Axillary, resp. rate (!) 27, height 5\' 4"  (1.626 m), weight 83.9 kg, SpO2 98 %.    Vent Mode: PRVC FiO2 (%):  [60 %-100 %] 100 % Set Rate:  [18 bmp-34 bmp] 34 bmp Vt Set:  [400 mL-580 mL] 420 mL PEEP:  [8 cmH20-10 cmH20] 10 cmH20 Plateau Pressure:  [21 cmH20-28 cmH20] 23 cmH20   Intake/Output Summary (Last 24 hours) at 10/28/2020 0845 Last data filed at 10/28/2020 0600 Gross per 24 hour  Intake 5606.29 ml  Output 765 ml  Net 4841.29 ml   Filed Weights   2020/11/14 1800 10/28/20 0500  Weight: 82.2 kg 83.9 kg    Examination: Constitutional: intubated jaundiced ill appearing man  Eyes: upward gaze fixed, pupils pinpoint, +scleral icterus, not tracking Ears, nose, mouth, and throat: ETT in place, OGT with bilious output Cardiovascular: Tachycardic, HSM over precordium Respiratory: dyssynchronous with vent, rhonci throughout Gastrointestinal: slightly distended question ascites Skin: diffuse oozing from foley, a line, central line, mouth Neurologic: GCS3, cough/gag/breathing intact Psychiatric: RASS cannot be determined  Liver/kidney injury noted CXR ARDS  Resolved Hospital Problem list     Assessment & Plan:  OOH Cardiac arrest- suspicion here is EtOH withdrawal leading to seizure/aspiration event.  This aspiration led to ARDS, decompensated alcoholic cirrhosis, coagulopathy, and distributive shock.  RUQ limited 10/30/20 done at [facility] showed signs of early cirrhosis.  I see no signs of variceal bleed, hgb has been stable.    List of issues: decompensated EtOH cirrhosis, post cardiac arrest encephalopathy (anoxic  vs. Hepatic vs. Ongoing seizures), distributive shock, coagulopathy of SIRS+cirrhosis, ARDS from aspiration  - DC octreotide and PPI gtt, okay for PPI BID - No role for GI consult at present - Check TEG with DIC panel to help guide coagulopathy correction - Start stress steroids,  continue vasopressors - DC vanc, continue zosyn; this should cover any concurrent SBP, no need to tap at present - ARDS LTVV, add PRN NMB for dysynchrony, given degree of coagulopathy and hemodynamic instability do not think proning is good option here - Get EEG hooked up ASAP - Poor prognosis relayed to family  Best practice (evaluated daily)  Diet: NPO Pain/Anxiety/Delirium protocol (if indicated): Propofol/as needed fentanyl VAP protocol (if indicated): Ordered DVT prophylaxis: SCDs GI prophylaxis: Protonix Glucose control: CBG monitoring Mobility: Bedrest Disposition: ICU  Goals of Care:  Family at bedside, grim prognosis relayed, will see how next 24-48 hours plays out and address GOC daily   Patient critically ill due to shock, encephalopathy, resp failure Interventions to address this today vent/pressor titration Risk of deterioration without these interventions is high  I personally spent 41 minutes providing critical care not including any separately billable procedures  Myrla Halsted MD  Pulmonary Critical Care  Prefer epic messenger for cross cover needs If after hours, please call E-link

## 2020-10-28 NOTE — Consult Note (Signed)
Neurology Consultation Reason for Consult: Concern for anoxic brain injury Requesting Physician: Levon Hedger  CC: Cardiac arrest  History is obtained from: Family and chart review  HPI: Logan Shaw is a 45 y.o. male with past medical history significant for alcohol use disorder, concern for alcoholic cirrhosis and prior history of GI bleeding.  Due to the health effects of alcohol he was advised to stop drinking.  Wife believes he stopped drinking Thursday prior to admission.  There was concern for possible delirium tremens due to agitation/hallucination and he was hypoxic on home pulse oximetry but he refused to be assessed for this at that time.  Later in the day she found him down and started CPR.  Hospital course has been complicated by shock (likely mixed septic and hypovolemic), ARDS, upper GI bleed, decompensated cirrhosis, acute kidney injury, severe lactic acidosis.  EEG demonstrated burst suppression with concern for epileptogenic activity for which neurology was consulted  ROS: Unable to obtain due to altered mental status.   Past Medical History:  Diagnosis Date  . Liver disease, chronic, with cirrhosis (HCC)    Current Outpatient Medications  Medication Instructions  . aspirin-acetaminophen-caffeine (EXCEDRIN MIGRAINE) 250-250-65 MG tablet 2 tablets, Oral, Every 6 hours PRN  . dicyclomine (BENTYL) 10 mg, Oral, 2 times daily  . fluticasone (FLONASE) 50 MCG/ACT nasal spray 1 spray, Each Nare, At bedtime PRN  . loperamide (IMODIUM) 2-4 mg, Oral, See admin instructions, Take 2 capsules (4 mg) by mouth at 1st loose stool, diarrhea, then take 1 capsule (2 mg) after each subsequent loose stool, diarrhea  . phenylephrine-shark liver oil-mineral oil-petrolatum (PREPARATION H) 0.25-14-74.9 % rectal ointment 1 application, Rectal, 2 times daily PRN    No family history on file. Unable to obtain from patient given his mental status  Social History:  has no history on file for  tobacco use, alcohol use, and drug use. Unable to obtain from patient given his mental status, alcohol use history per chart review  Exam: Current vital signs: BP (!) 98/49   Pulse (!) 128   Temp 98.9 F (37.2 C) (Axillary)   Resp (!) 34   Ht 5\' 4"  (1.626 m)   Wt 83.9 kg   SpO2 93%   BMI 31.75 kg/m  Vital signs in last 24 hours: Temp:  [97.1 F (36.2 C)-99.5 F (37.5 C)] 98.9 F (37.2 C) (03/13 2000) Pulse Rate:  [6-128] 128 (03/13 2028) Resp:  [10-38] 34 (03/13 2028) BP: (91-148)/(31-59) 98/49 (03/13 2028) SpO2:  [85 %-100 %] 93 % (03/13 2028) Arterial Line BP: (84-127)/(41-56) 88/44 (03/13 2000) FiO2 (%):  [60 %-100 %] 100 % (03/13 2028) Weight:  [83.9 kg] 83.9 kg (03/13 0500)   Physical Exam  Constitutional: Appears acutely ill Psych: Unresponsive Eyes: No eye-opening HENT: ET tube in place MSK: no joint deformities.  Cardiovascular: Pale throughout with weak pulses Respiratory: Comfortable on the ventilator only when paralyzed GI: Coffee-ground output in OG tube Skin: Edematous  Neuro: Exam deferred given patient is paralyzed for vent dyssynchrony  I have reviewed labs in epic and the results pertinent to this consultation are: Persistently elevated lactate to 11 Creatinine 1.6 AST 1142, ALT 291, Ammonia 298 Troponin rising to 138   I have reviewed the images obtained: Head CT without acute intracranial process at time of admission on 3/12   Impression: This is a 45 year old male unfortunately in multiorgan failure after cardiac arrest, possibly secondary to aspiration given family report of hypoxia prior to being found down.  Neurological  examination cannot be completed given he is requiring paralytics to be stable on ventilator, though the fact that he does need paralytics to be stable on the ventilator suggests some intact brainstem function.  EEG is concerning for epileptogenic activity and given family feels he would want full aggressive medical  measures at this time, will attempt to control seizure activity to the amount allowed by his blood pressure  Recommendations: -Versed bolus given at half normal loading dose of 0.1 mg/KG (instead of 0.2 mg/kg due to liver function) -Versed initially started at a maintenance infusion of 0.05 mg/kg/h (approximately 4 mg/hr).  Due to MAP <65 at this rate, reduced to 1 mg/hr -Keppra loaded at 4 g loading dose, followed by 1 g every 12 hours given his current renal function  -Keppra will need to be adjusted per renal function going forward -Appreciate continued goals of care conversations by primary team -Consider palliative care consult -Neurology will continue to follow  Discussed with wife that neurological prognosis cannot be completed at this time given paralytics and his general medical condition.  She expressed understanding that he may not survive the multiorgan failure he is currently experiencing.  We discussed that should he continue to survive, repeat brain imaging in 2 to 3 days may provide more information about the extent of his brain injury from the cardiac arrest.  All of her questions were answered.  Brooke Dare MD-PhD [facility] [phone]  Total critical care time: 88 minutes   Critical care time was exclusive of separately billable procedures and treating other patients.   Critical care was necessary to treat or prevent imminent or life-threatening deterioration.   Critical care was time spent personally by me on the following activities: development of treatment plan with patient and/or surrogate as well as nursing, discussions with consultants/primary team, evaluation of patient's response to treatment, examination of patient, obtaining history from patient or surrogate, ordering and performing treatments and interventions, ordering and review of laboratory studies, ordering and review of radiographic studies, and re-evaluation of patient's condition as  needed, as documented above.

## 2020-10-28 NOTE — Progress Notes (Signed)
Shift Summary: remains GCS 3, receiving IVP paralytics, EEG, echo, receiving multiple blood products for coagulation  N: UTA, pupils 61mm and fixed, no withdraw to pain, slight eyelid flutter with hard sternal rub, no corneal reflex, no gag, weak cough with deep suction. Train of four 4/4 on 73mA prior to paralytics. Pushign prn Vecuronium for vent dyssynchrony and desatting. Continuous video EEG running. BIS 35-45 while adequately paralyzed. Afebrile. No evidence of pain. Sedated with fentanyl, propofol, versed. SAT completed, neuro assessment remains the same.   R: ETT 100% + 10, RR 34, TV 420. Desatting with suction and turns with a long recovery time. Coarse lung sounds. Green/bile secretions from ETT. Satting 94% while paralyzed.   CV: ST, no ectopy. MAP goal greater than 65 unmet majority of shift r/t sedation requirements. Titrating propofol and added versed gtt to adequately sedate without affecting BP. MAP meeting 64-66 on 0.04units/min and 35mcg/min of levophed. Pale and cool throughout, palpable pulses 1+.   GI: OGT to LIS with coffee ground output. Hypoactive tones before paralytic, no BM, no gas. BG borderline low, on D5LR.   GU: foley in place, low UOP, MD notified. Urine dark tea, sediment. Penis is bleeding.   Skin: Skin intact, unable to turn enough to assess sacrum due to hemodynamic instability. Coagulation panel sent, 3 FFP + 1 cryo + 1 DDAVP given. Repeat coagulation panel ordered for tonight. Receiving zosyn, keppra, thiamine, folic acid, solu-cortef.   CDC consulted 3/13 at 0730. Will update with changes in condition or plan of care. Reference number: [id number].   Wife Dahlia Client and mother Nicole Cella at bedside. Aware of prognosis and understanding of treatment plan.   Kemond Amorin RN

## 2020-10-28 NOTE — Progress Notes (Signed)
EEG maintenance done, no skin break seen.

## 2020-10-28 NOTE — Progress Notes (Signed)
  Echocardiogram 2D Echocardiogram has been performed.  Logan Shaw 10/28/2020, 11:33 AM

## 2020-10-28 NOTE — Progress Notes (Signed)
Routine EEG complete, LTM next

## 2020-10-28 NOTE — Procedures (Addendum)
Patient Name: Logan Shaw  MRN: [id number]  Epilepsy Attending: Charlsie Quest  Referring Physician/Provider: Dr Cheri Fowler Date: 10/28/2020 Duration: 37.58 mins  Patient history: 44yo m s/p cardiac arrrest. EEG to evaluate for seizure  Level of alertness:  comatose  AEDs during EEG study: None  Technical aspects: This EEG study was done with scalp electrodes positioned according to the 10-20 International system of electrode placement. Electrical activity was acquired at a sampling rate of 500Hz  and reviewed with a high frequency filter of 70Hz  and a low frequency filter of 1Hz . EEG data were recorded continuously and digitally stored.   Description: EEG showed bursts of epileptiform discharges lasting 0.5-1 second alternating with 5-10seconds of generalized eeg suppression.  Hyperventilation and photic stimulation were not performed.     ABNORMALITY - Burst suppression with epileptiform discharges, generalized   IMPRESSION: This study is showed evidence of generalized epileptogenicity as well as profound diffuse encephalopathy. No seizures were seen throughout the recording.  Logan Shaw 

## 2020-10-28 NOTE — Progress Notes (Signed)
eLink Physician-Brief Progress Note Patient Name: Logan Shaw DOB: [birth year] MRN: [id number]   Date of Service  10/28/2020  HPI/Events of Note  ABG 7.37/26/55/16/BE -9.  Vent Mode: PRVC FiO2 (%):  100 % Set Rate: 34 bmp Vt Set:  500 mL PEEP:  [8 cmH20] 8 cmH20 Plateau Pressure:  [21 cmH20-28 cmH20] 21 cmH20   eICU Interventions  Decrease VT to 420. Increase PEEP to 10. Repeat ABG in 2 hours.     Intervention Category Major Interventions: Acid-Base disturbance - evaluation and management;Respiratory failure - evaluation and management  Janae Bridgeman 10/28/2020, 3:48 AM

## 2020-10-28 NOTE — Progress Notes (Signed)
IVT consult notes: IO access removed . Minimal bleeding from site.Area dressed,covered w/ gauze dressing.

## 2020-10-29 DIAGNOSIS — Z452 Encounter for adjustment and management of vascular access device: Secondary | ICD-10-CM | POA: Diagnosis not present

## 2020-10-29 DIAGNOSIS — R092 Respiratory arrest: Secondary | ICD-10-CM

## 2020-10-29 DIAGNOSIS — I469 Cardiac arrest, cause unspecified: Secondary | ICD-10-CM | POA: Diagnosis not present

## 2020-10-29 DIAGNOSIS — G931 Anoxic brain damage, not elsewhere classified: Secondary | ICD-10-CM

## 2020-10-29 LAB — HEPATIC FUNCTION PANEL
ALT: 537 U/L — ABNORMAL HIGH (ref 0–44)
ALT: 564 U/L — ABNORMAL HIGH (ref 0–44)
AST: 1880 U/L — ABNORMAL HIGH (ref 15–41)
AST: 1921 U/L — ABNORMAL HIGH (ref 15–41)
Albumin: 2.1 g/dL — ABNORMAL LOW (ref 3.5–5.0)
Albumin: 2.1 g/dL — ABNORMAL LOW (ref 3.5–5.0)
Alkaline Phosphatase: 56 U/L (ref 38–126)
Alkaline Phosphatase: 60 U/L (ref 38–126)
Bilirubin, Direct: 6.4 mg/dL — ABNORMAL HIGH (ref 0.0–0.2)
Bilirubin, Direct: 6.8 mg/dL — ABNORMAL HIGH (ref 0.0–0.2)
Indirect Bilirubin: 5 mg/dL — ABNORMAL HIGH (ref 0.3–0.9)
Indirect Bilirubin: 5.2 mg/dL — ABNORMAL HIGH (ref 0.3–0.9)
Total Bilirubin: 11.4 mg/dL — ABNORMAL HIGH (ref 0.3–1.2)
Total Bilirubin: 12 mg/dL — ABNORMAL HIGH (ref 0.3–1.2)
Total Protein: 6.3 g/dL — ABNORMAL LOW (ref 6.5–8.1)
Total Protein: 6.1 g/dL — ABNORMAL LOW (ref 6.5–8.1)

## 2020-10-29 LAB — PREPARE FRESH FROZEN PLASMA: Unit division: 0

## 2020-10-29 LAB — BASIC METABOLIC PANEL
Anion gap: 21 — ABNORMAL HIGH (ref 5–15)
BUN: 25 mg/dL — ABNORMAL HIGH (ref 6–20)
CO2: 18 mmol/L — ABNORMAL LOW (ref 22–32)
Calcium: 7.7 mg/dL — ABNORMAL LOW (ref 8.9–10.3)
Chloride: 94 mmol/L — ABNORMAL LOW (ref 98–111)
Creatinine, Ser: 2.32 mg/dL — ABNORMAL HIGH (ref 0.61–1.24)
GFR, Estimated: 35 mL/min — ABNORMAL LOW (ref >60)
Glucose, Bld: 120 mg/dL — ABNORMAL HIGH (ref 70–99)
Potassium: 4.4 mmol/L (ref 3.5–5.1)
Sodium: 133 mmol/L — ABNORMAL LOW (ref 135–145)

## 2020-10-29 LAB — POCT I-STAT 7, (LYTES, BLD GAS, ICA,H+H)
Acid-base deficit: 5 mmol/L — ABNORMAL HIGH (ref 0.0–2.0)
Bicarbonate: 21.7 mmol/L (ref 20.0–28.0)
Calcium, Ion: 0.97 mmol/L — ABNORMAL LOW (ref 1.15–1.40)
HCT: 31 % — ABNORMAL LOW (ref 39.0–52.0)
Hemoglobin: 10.5 g/dL — ABNORMAL LOW (ref 13.0–17.0)
O2 Saturation: 100 %
Patient temperature: 36.8
Potassium: 4.4 mmol/L (ref 3.5–5.1)
Sodium: 135 mmol/L (ref 135–145)
TCO2: 23 mmol/L (ref 22–32)
pCO2 arterial: 46.5 mmHg (ref 32.0–48.0)
pH, Arterial: 7.276 — ABNORMAL LOW (ref 7.350–7.450)
pO2, Arterial: 262 mmHg — ABNORMAL HIGH (ref 83.0–108.0)

## 2020-10-29 LAB — CALCIUM, IONIZED: Calcium, Ionized, Serum: 3.6 mg/dL — ABNORMAL LOW (ref 4.5–5.6)

## 2020-10-29 LAB — BPAM FFP
Blood Product Expiration Date: 202203182359
Blood Product Expiration Date: 202203182359
Blood Product Expiration Date: 202203182359
Blood Product Expiration Date: 202203182359
ISSUE DATE / TIME: 202203131206
ISSUE DATE / TIME: 202203131206
ISSUE DATE / TIME: 202203131206
ISSUE DATE / TIME: 202203131206
Unit Type and Rh: 5100
Unit Type and Rh: 5100
Unit Type and Rh: 5100
Unit Type and Rh: 5100

## 2020-10-29 LAB — CBC
HCT: 29.4 % — ABNORMAL LOW (ref 39.0–52.0)
Hemoglobin: 9.7 g/dL — ABNORMAL LOW (ref 13.0–17.0)
MCH: 38.8 pg — ABNORMAL HIGH (ref 26.0–34.0)
MCHC: 33 g/dL (ref 30.0–36.0)
MCV: 117.6 fL — ABNORMAL HIGH (ref 80.0–100.0)
Platelets: 48 10*3/uL — ABNORMAL LOW (ref 150–400)
RBC: 2.5 MIL/uL — ABNORMAL LOW (ref 4.22–5.81)
RDW: 18.2 % — ABNORMAL HIGH (ref 11.5–15.5)
WBC: 17.6 10*3/uL — ABNORMAL HIGH (ref 4.0–10.5)
nRBC: 1.8 % — ABNORMAL HIGH (ref 0.0–0.2)

## 2020-10-29 LAB — PREPARE CRYOPRECIPITATE
Unit division: 0
Unit division: 0

## 2020-10-29 LAB — DIC (DISSEMINATED INTRAVASCULAR COAGULATION)PANEL
D-Dimer, Quant: >20 ug/mL-FEU — ABNORMAL HIGH (ref 0.00–0.50)
Fibrinogen: 140 mg/dL — ABNORMAL LOW (ref 210–475)
INR: 5.2 (ref 0.8–1.2)
Platelets: 47 10*3/uL — ABNORMAL LOW (ref 150–400)
Prothrombin Time: 46.4 seconds — ABNORMAL HIGH (ref 11.4–15.2)
Smear Review: NONE SEEN
aPTT: 45 seconds — ABNORMAL HIGH (ref 24–36)

## 2020-10-29 LAB — GLUCOSE, CAPILLARY
Glucose-Capillary: 106 mg/dL — ABNORMAL HIGH (ref 70–99)
Glucose-Capillary: 124 mg/dL — ABNORMAL HIGH (ref 70–99)
Glucose-Capillary: 124 mg/dL — ABNORMAL HIGH (ref 70–99)
Glucose-Capillary: 135 mg/dL — ABNORMAL HIGH (ref 70–99)
Glucose-Capillary: 161 mg/dL — ABNORMAL HIGH (ref 70–99)
Glucose-Capillary: 166 mg/dL — ABNORMAL HIGH (ref 70–99)
Glucose-Capillary: 54 mg/dL — ABNORMAL LOW (ref 70–99)
Glucose-Capillary: 59 mg/dL — ABNORMAL LOW (ref 70–99)

## 2020-10-29 LAB — BPAM CRYOPRECIPITATE
Blood Product Expiration Date: 202203130640
Blood Product Expiration Date: 202203131704
ISSUE DATE / TIME: 202203130336
ISSUE DATE / TIME: 202203131132
Unit Type and Rh: 5100
Unit Type and Rh: 6200

## 2020-10-29 LAB — PROTIME-INR
INR: 4.5 (ref 0.8–1.2)
Prothrombin Time: 41.4 seconds — ABNORMAL HIGH (ref 11.4–15.2)

## 2020-10-29 LAB — PHOSPHORUS: Phosphorus: 6.8 mg/dL — ABNORMAL HIGH (ref 2.5–4.6)

## 2020-10-29 LAB — MAGNESIUM: Magnesium: 2.1 mg/dL (ref 1.7–2.4)

## 2020-10-29 MED ORDER — ACETAMINOPHEN 500 MG PO TABS: 500.0000 mg | ORAL_TABLET | Freq: Four times a day (QID) | ORAL | Status: DC | PRN

## 2020-10-29 MED ORDER — VITAMIN K1 10 MG/ML IJ SOLN
5.0000 mg | Freq: Once | INTRAVENOUS | Status: AC
  Administered 2020-10-29: 5 mg via INTRAVENOUS
  Filled 2020-10-29: qty 0.5

## 2020-10-29 MED ORDER — ACETAMINOPHEN 160 MG/5ML PO SOLN
500.0000 mg | Freq: Four times a day (QID) | ORAL | Status: DC | PRN
  Administered 2020-10-29: 500 mg
  Filled 2020-10-29: qty 20.3

## 2020-10-29 MED ORDER — SODIUM BICARBONATE 8.4 % IV SOLN
INTRAVENOUS | Status: AC
  Administered 2020-10-29: 50 meq via INTRAVENOUS
  Filled 2020-10-29: qty 50

## 2020-10-29 MED ORDER — DEXTROSE 50 % IV SOLN
INTRAVENOUS | Status: AC
  Filled 2020-10-29: qty 50

## 2020-10-29 MED ORDER — DEXTROSE 10 % IV SOLN: INTRAVENOUS | Status: DC

## 2020-10-29 MED ORDER — SODIUM CHLORIDE 0.9% IV SOLUTION: Freq: Once | INTRAVENOUS | Status: AC

## 2020-10-29 MED ORDER — DEXTROSE 50 % IV SOLN
12.5000 g | INTRAVENOUS | Status: AC
  Administered 2020-10-29: 12.5 g via INTRAVENOUS

## 2020-10-29 NOTE — Procedures (Addendum)
Patient Name: Logan Shaw  MRN: [id number]  Epilepsy Attending: Charlsie Quest  Referring Physician/Provider: Dr Levon Hedger Duration: 10/28/2020 1010 to 10/29/2020 1010  Patient history: 44yo m s/p cardiac arrrest. EEG to evaluate for seizure  Level of alertness:  comatose  AEDs during EEG study: LEV, Versed, Propofol  Technical aspects: This EEG study was done with scalp electrodes positioned according to the 10-20 International system of electrode placement. Electrical activity was acquired at a sampling rate of 500Hz  and reviewed with a high frequency filter of 70Hz  and a low frequency filter of 1Hz . EEG data were recorded continuously and digitally stored.   Description: EEG initially showed bursts of epileptiform discharges lasting 0.5-1 second alternating with 5-10seconds of generalized eeg suppression. As AEDs were adjusted, eeg showed generalized suppression with generalized spikes every 3-7 seconds. Hyperventilation and photic stimulation were not performed.     ABNORMALITY - Spikes, generalized - Background suppression, generalized   IMPRESSION: This study is showed evidence of generalized epileptogenicity as well as profound diffuse encephalopathy. No seizures were seen throughout the recording.  Logan Shaw 

## 2020-10-29 NOTE — Progress Notes (Signed)
Subjective: No significant changes  Exam: Vitals:   10/29/20 1215 10/29/20 1220  BP:    Pulse: (!) 129 (!) 129  Resp: (!) 27 (!) 40  Temp: 99.86 F (37.7 C) 99.86 F (37.7 C)  SpO2: 98% 98%   Gen: In bed, intubated Resp: Ventilated Abd: soft, nt  Neuro: MS: Does not open eyes or follow commands CN: Pupils equal round and reactive, very weak corneal on the left, was unable to get a reaction on the right, no reaction to doll's eye maneuver.  Cough is present Motor: No movement to noxious stimulation Sensory: As above   Pertinent Labs: Ammonia of 298 on admission  Impression: 45 year old male who presents with presumed aspiration due to DTs.  Now with ARDS and severe shock.  His EEG is quite concerning in the post anoxic arrest setting.  His severe hyperammonemia is a potential confounding factor, and I would favor rechecking this to ensure it is coming down.  He did have a periodic pattern on his EEG, but it is not clear to me that aggressive treatment of this pattern with Versed will really be of much benefit to him.  I will back off on the Versed for now and continue EEG monitoring.  We are planning on imaging tomorrow.  Recommendations: 1) discontinue Versed 2) continue EEG monitoring for now 3) continue levetiracetam 1g BID 4) plan for repeat MRI tomorrow  This patient is critically ill and at significant risk of neurological worsening, death and care requires constant monitoring of vital signs, hemodynamics,respiratory and cardiac monitoring, neurological assessment, discussion with family, other specialists and medical decision making of high complexity. I spent 35 minutes of neurocritical care time  in the care of  this patient. This was time spent independent of any time provided by nurse practitioner or PA.  Ritta Slot, MD [facility] [phone]  If 7pm- 7am, please page neurology on call as listed in AMION. 10/29/2020  6:15 PM

## 2020-10-29 NOTE — Progress Notes (Addendum)
This is a progress note   NAME:  Logan Shaw, MRN:  [id number], DOB:  [birth year], LOS: 2 ADMISSION DATE:  11-08-2020, CONSULTATION DATE: 08-Nov-2020 REFERRING MD: Olivia Canter, CHIEF COMPLAINT: Status post cardiac arrest  Brief History:  45 year old male with with alcohol abuse, recently diagnosed alcholic liver disease question cirrhosis who presented status post cardiac arrest, witnessed by his wife and bystander, ROSC achieved after 12 minutes.  Now in ARDS and septic shock.  History of Present Illness:  45 year old male with history of alcohol abuse, alcoholic liver cirrhosis and prior history of GI bleeding who presented status post cardiac arrest this evening. Most of the history is taken from medical record as patient is intubated, unresponsive and sedated. As per patient's wife he was in his usual state of health until about 2 days ago when he started feeling generalized weakness, he was seen at GI clinic for elevated LFTs, he was not feeling well whole day today, later on wife checked on him he was found unresponsive, they started CPR and EMS was summoned, ROSC was achieved after 12 minutes of CPR.  King's airway was placed and patient was brought into the emergency department. In the emergency department King's airway was changed to ET tube, patient was noted to have bleeding from ETT and OGT.  Past Medical History:   Past Medical History:  Diagnosis Date  . Liver disease, chronic, with cirrhosis (HCC)      Significant Hospital Events:  Admit 3/12  Consults:  PCCM  Procedures:  ETT 3/12 >  Significant Diagnostic Tests:  X-ray chest: Reviewed by myself showing bilateral airspace disease consistent with ARDS\ 3/12 CT head: Pending  Micro Data:    Antimicrobials:  Vancomycin 3/12 >>3/13 Zosyn 3/12 >>  Interim History / Subjective:  He was hypoglycemic and was started on a D10 infusion he was also discovered to be hypotensive for which Vania Rea was added.  This  morning, I had the pleasure of speaking to his wife and mother.  He remains comatose  Objective   Blood pressure (!) 91/51, pulse (!) 123, temperature 98.2 F (36.8 C), resp. rate (!) 34, height 5\' 4"  (1.626 m), weight 86.5 kg, SpO2 100 %.    Vent Mode: PRVC FiO2 (%):  [90 %-100 %] 100 % Set Rate:  [34 bmp] 34 bmp Vt Set:  [420 mL] 420 mL PEEP:  [10 cmH20-14 cmH20] 14 cmH20 Plateau Pressure:  [28 cmH20-34 cmH20] 31 cmH20   Intake/Output Summary (Last 24 hours) at 10/29/2020 0647 Last data filed at 10/29/2020 0600 Gross per 24 hour  Intake 5571.19 ml  Output 365 ml  Net 5206.19 ml   Filed Weights   Nov 08, 2020 1800 10/28/20 0500 10/29/20 0444  Weight: 82.2 kg 83.9 kg 86.5 kg    Examination: Constitutional: Critically ill middle-age gentleman who remains comatose Eyes: Pupils pinpoint, scleral icterus, not tracking, no corneal reflex, positive doll's eye Ears, nose, mouth, and throat: ETT in place Cardiovascular: Tachycardic, no murmurs, gallops, rubs Respiratory: Ventilatory sounds present, no obvious crackles or rhonchi Gastrointestinal: Abdomen distended, unable to appreciate if there is tenderness to palpation Skin: Central venous catheter site on the left internal jugular with a little bit of wheezing Neurologic: Comatose, no corneal reflex, pupils pinpoint and not reactive to light, positive doll's eye, no gag reflex or cough reflex  Resolved Hospital Problem list     Assessment & Plan:  OOH Cardiac arrest- suspicion here is EtOH withdrawal leading to seizure/aspiration event.  This aspiration led to ARDS, decompensated  alcoholic cirrhosis, coagulopathy, and distributive shock.  RUQ limited US done at [facility] showed signs of early cirrhosis.  Hemoglobin is stable thus making variceal bleed less likely  List of issues: decompensated EtOH cirrhosis, post cardiac arrest encephalopathy (anoxic vs. Hepatic vs. Ongoing seizures), distributive shock, coagulopathy of SIRS+cirrhosis, ARDS  from aspiration  -With ARDS, continue LTVV, 4-8cc/kg IBW with goal Pplat <30 and DP<15.  I do not believe he will tolerate proning -Continue Zosyn -Follow-up official read from EEG -For seizures, appreciate neurology recommendation and continue Keppra, Versed infusion -Obtain MRI of the brain to assist with prognostication -Follow-up DIC panel this a.m.  *Transfuse platelet if <20,000  *Other options for managing his DIC include 33F-PCC, Vit K, FFP given cirrhosis and  possible volume overload  *Cryoprecipitate if fibrinogen <100 -Prognosis is very grim and per discussion with the family this morning, they still would like to continue current care and remain full code.  In the event that he does go into cardiac arrest, they are understandable and agreed to short resuscitation  -Continue Giapreza, stress dose steroids, Levophed, vasopressin for hemodynamic support  Best practice (evaluated daily)  Diet: NPO Pain/Anxiety/Delirium protocol (if indicated): Propofol/as needed fentanyl VAP protocol (if indicated): Yes DVT prophylaxis: SCDs GI prophylaxis: Protonix Glucose control: CBG monitoring Mobility: Bedrest Disposition: ICU  Goals of Care:  Family at bedside, Prognosis is very grim and per discussion with the family this morning, they still would like to continue current care and remain full code.  In the event that he does go into cardiac arrest, they are understandable and agreed to short resuscitation   Jodelle Red, MD PGY-3 Internal Medicine Teaching Service

## 2020-10-29 NOTE — Progress Notes (Signed)
eLink Physician-Brief Progress Note Patient Name: Logan Shaw DOB: 04/01/1976 MRN: [id number]   Date of Service  10/29/2020  HPI/Events of Note  Hypoglycemic episode despite D5 LR @ 75cc/hr.  Vania Rea has significantly improved the patient's BP.   eICU Interventions  Continue Giapreza.  Stop D5 LR. Start D10W @ 60cc/hr.     Intervention Category Intermediate Interventions: Other:  Janae Bridgeman 10/29/2020, 1:02 AM

## 2020-10-29 NOTE — Plan of Care (Signed)

## 2020-10-30 ENCOUNTER — Inpatient Hospital Stay (HOSPITAL_COMMUNITY): Payer: 59

## 2020-10-30 DIAGNOSIS — I469 Cardiac arrest, cause unspecified: Secondary | ICD-10-CM | POA: Diagnosis not present

## 2020-10-30 LAB — CBC
HCT: 26.2 % — ABNORMAL LOW (ref 39.0–52.0)
Hemoglobin: 8.8 g/dL — ABNORMAL LOW (ref 13.0–17.0)
MCH: 38.8 pg — ABNORMAL HIGH (ref 26.0–34.0)
MCHC: 33.6 g/dL (ref 30.0–36.0)
MCV: 115.4 fL — ABNORMAL HIGH (ref 80.0–100.0)
Platelets: 46 10*3/uL — ABNORMAL LOW (ref 150–400)
RBC: 2.27 MIL/uL — ABNORMAL LOW (ref 4.22–5.81)
RDW: 18.2 % — ABNORMAL HIGH (ref 11.5–15.5)
WBC: 24.7 10*3/uL — ABNORMAL HIGH (ref 4.0–10.5)
nRBC: 3.4 % — ABNORMAL HIGH (ref 0.0–0.2)

## 2020-10-30 LAB — URINALYSIS, ROUTINE W REFLEX MICROSCOPIC
Bacteria, UA: NONE SEEN
Glucose, UA: NEGATIVE mg/dL
Ketones, ur: NEGATIVE mg/dL
Leukocytes,Ua: NEGATIVE
Nitrite: NEGATIVE
Protein, ur: 30 mg/dL — AB
RBC / HPF: >50 RBC/hpf — ABNORMAL HIGH (ref 0–5)
Specific Gravity, Urine: 1.026 (ref 1.005–1.030)
pH: 5 (ref 5.0–8.0)

## 2020-10-30 LAB — COMPREHENSIVE METABOLIC PANEL
ALT: 589 U/L — ABNORMAL HIGH (ref 0–44)
AST: 1513 U/L — ABNORMAL HIGH (ref 15–41)
Albumin: 2 g/dL — ABNORMAL LOW (ref 3.5–5.0)
Alkaline Phosphatase: 61 U/L (ref 38–126)
Anion gap: 15 (ref 5–15)
BUN: 34 mg/dL — ABNORMAL HIGH (ref 6–20)
CO2: 22 mmol/L (ref 22–32)
Calcium: 7.5 mg/dL — ABNORMAL LOW (ref 8.9–10.3)
Chloride: 95 mmol/L — ABNORMAL LOW (ref 98–111)
Creatinine, Ser: 3.16 mg/dL — ABNORMAL HIGH (ref 0.61–1.24)
GFR, Estimated: 24 mL/min — ABNORMAL LOW (ref >60)
Glucose, Bld: 223 mg/dL — ABNORMAL HIGH (ref 70–99)
Potassium: 4.3 mmol/L (ref 3.5–5.1)
Sodium: 132 mmol/L — ABNORMAL LOW (ref 135–145)
Total Bilirubin: 13.7 mg/dL — ABNORMAL HIGH (ref 0.3–1.2)
Total Protein: 6.2 g/dL — ABNORMAL LOW (ref 6.5–8.1)

## 2020-10-30 LAB — MAGNESIUM: Magnesium: 2.3 mg/dL (ref 1.7–2.4)

## 2020-10-30 LAB — GLUCOSE, CAPILLARY
Glucose-Capillary: 129 mg/dL — ABNORMAL HIGH (ref 70–99)
Glucose-Capillary: 162 mg/dL — ABNORMAL HIGH (ref 70–99)
Glucose-Capillary: 183 mg/dL — ABNORMAL HIGH (ref 70–99)
Glucose-Capillary: 210 mg/dL — ABNORMAL HIGH (ref 70–99)
Glucose-Capillary: 220 mg/dL — ABNORMAL HIGH (ref 70–99)
Glucose-Capillary: 235 mg/dL — ABNORMAL HIGH (ref 70–99)

## 2020-10-30 LAB — BPAM FFP
Blood Product Expiration Date: 202203192359
ISSUE DATE / TIME: 202203141153
Unit Type and Rh: 5100

## 2020-10-30 LAB — PREPARE FRESH FROZEN PLASMA: Unit division: 0

## 2020-10-30 LAB — AMMONIA: Ammonia: 170 umol/L — ABNORMAL HIGH (ref 9–35)

## 2020-10-30 LAB — PHOSPHORUS: Phosphorus: 5.1 mg/dL — ABNORMAL HIGH (ref 2.5–4.6)

## 2020-10-30 LAB — PROTIME-INR
INR: 5.2 (ref 0.8–1.2)
Prothrombin Time: 46.3 seconds — ABNORMAL HIGH (ref 11.4–15.2)

## 2020-10-30 MED ORDER — VITAL HIGH PROTEIN PO LIQD
1000.0000 mL | ORAL | Status: DC
  Administered 2020-10-30 – 2020-10-31 (×2): 1000 mL

## 2020-10-30 MED ORDER — DOCUSATE SODIUM 50 MG/5ML PO LIQD: 100.0000 mg | Freq: Two times a day (BID) | ORAL | Status: DC | PRN

## 2020-10-30 MED ORDER — VITAMIN K1 10 MG/ML IJ SOLN
5.0000 mg | Freq: Every day | INTRAVENOUS | Status: DC
  Administered 2020-10-30: 5 mg via INTRAVENOUS
  Filled 2020-10-30 (×3): qty 0.5

## 2020-10-30 MED ORDER — LACTULOSE 10 GM/15ML PO SOLN
30.0000 g | Freq: Three times a day (TID) | ORAL | Status: DC
  Administered 2020-10-30 – 2020-11-01 (×8): 30 g
  Filled 2020-10-30 (×8): qty 45

## 2020-10-30 MED ORDER — HYDROCORTISONE NA SUCCINATE PF 100 MG IJ SOLR
50.0000 mg | Freq: Two times a day (BID) | INTRAMUSCULAR | Status: DC
Start: 2020-10-30 — End: 2020-11-01
  Administered 2020-10-30 – 2020-11-01 (×4): 50 mg via INTRAVENOUS
  Filled 2020-10-30 (×4): qty 2

## 2020-10-30 MED ORDER — PIPERACILLIN-TAZOBACTAM 3.375 G IVPB
3.3750 g | Freq: Three times a day (TID) | INTRAVENOUS | Status: DC
Start: 2020-10-30 — End: 2020-10-31
  Administered 2020-10-30 – 2020-10-31 (×4): 3.375 g via INTRAVENOUS
  Filled 2020-10-30 (×3): qty 50

## 2020-10-30 MED ORDER — RIFAXIMIN 550 MG PO TABS
550.0000 mg | ORAL_TABLET | Freq: Two times a day (BID) | ORAL | Status: DC
  Administered 2020-10-30 – 2020-11-01 (×4): 550 mg
  Filled 2020-10-30 (×5): qty 1

## 2020-10-30 MED ORDER — SODIUM CHLORIDE 0.9 % IV SOLN
1.2500 ng/kg/min | INTRAVENOUS | Status: DC
  Administered 2020-10-31: 4 ng/kg/min via INTRAVENOUS
  Filled 2020-10-30: qty 1

## 2020-10-30 MED ORDER — RIFAXIMIN 550 MG PO TABS
550.0000 mg | ORAL_TABLET | Freq: Two times a day (BID) | ORAL | Status: DC
Start: 2020-10-30 — End: 2020-10-30
  Administered 2020-10-30: 550 mg via ORAL
  Filled 2020-10-30: qty 1

## 2020-10-30 NOTE — Progress Notes (Signed)
vLTM EEG complete. No skin breakdown 

## 2020-10-30 NOTE — Progress Notes (Signed)
Subjective: No significant changes  Exam: Vitals:   10/30/20 1000 10/30/20 1105  BP:    Pulse: (!) 107   Resp: (!) 30   Temp: 98.24 F (36.8 C)   SpO2: 100% 99%   Gen: In bed, intubated Resp: Ventilated Abd: soft, nt  Neuro: MS: Does not open eyes or follow commands CN: Pupils equal round and reactive, corneals were absent today, was unable to get a reaction on the right, no reaction to doll's eye maneuver.  Cough is absent.  Motor: No movement to noxious stimulation Sensory: As above   Pertinent Labs: Ammonia 170  Impression: 45 year old male who presents with presumed aspiration due to DTs.  Now with ARDS and severe shock.  His EEG is quite concerning in the post anoxic arrest setting.  His severe hyperammonemia is a potential confounding factor, and I would favor continuing to treat this. MRI could be helpful.   Recommendations: 1) discontinue EEG monitoring for now 2) continue levetiracetam 1g BID 3) plan for repeat MRI today  This patient is critically ill and at significant risk of neurological worsening, death and care requires constant monitoring of vital signs, hemodynamics,respiratory and cardiac monitoring, neurological assessment, discussion with family, other specialists and medical decision making of high complexity. I spent 35 minutes of neurocritical care time  in the care of  this patient. This was time spent independent of any time provided by nurse practitioner or PA.  Ritta Slot, MD [facility] [phone]  If 7pm- 7am, please page neurology on call as listed in AMION. 10/30/2020  12:36 PM

## 2020-10-30 NOTE — Progress Notes (Signed)
This is a progress note   NAME:  Logan Shaw, MRN:  [id number], DOB:  10-17-1975, LOS: 3 ADMISSION DATE:  10/26/2020, CONSULTATION DATE: 11/02/2020 REFERRING MD: Olivia Canter, CHIEF COMPLAINT: Status post cardiac arrest  Brief History:  45 year old male with with alcohol abuse, recently diagnosed alcholic liver disease question cirrhosis who presented status post cardiac arrest, witnessed by his wife and bystander, ROSC achieved after 12 minutes.  Also discovered to be in ARDS and septic shock.  History of Present Illness:  45 year old male with history of alcohol abuse, alcoholic liver cirrhosis and prior history of GI bleeding who presented status post cardiac arrest this evening. Most of the history is taken from medical record as patient is intubated, unresponsive and sedated.  As per patient's wife he was in his usual state of health until about 2 days ago when he started feeling generalized weakness, he was seen at GI clinic for elevated LFTs, he was not feeling well whole day today, later on wife checked on him he was found unresponsive, they started CPR and EMS was summoned, ROSC was achieved after 12 minutes of CPR.  King's airway was placed and patient was brought into the emergency department. In the emergency department King's airway was changed to ET tube, patient was noted to have bleeding from ETT and OGT.  Past Medical History:   Past Medical History:  Diagnosis Date  . Liver disease, chronic, with cirrhosis (HCC)      Significant Hospital Events:  Admit 3/12  Consults:  PCCM  Procedures:  ETT 3/12 >  Significant Diagnostic Tests:  X-ray chest: Reviewed by myself showing bilateral airspace disease consistent with ARDS\ 3/12 CT head: Ethmoid sinus disease. Otherwise no acute intracranial process.  Micro Data:  3/12 Blood Cx: NGTD  Antimicrobials:  Vancomycin 3/12 >>3/13 Zosyn 3/12 >>  Interim History / Subjective:  Lactulose increased, additional IV vitamin  K ordered.   Objective   Blood pressure (!) 126/51, pulse (!) 113, temperature 99.14 F (37.3 C), resp. rate (!) 30, height 5\' 4"  (1.626 m), weight 86.5 kg, SpO2 96 %.    Vent Mode: PRVC FiO2 (%):  [50 %-100 %] 50 % Set Rate:  [34 bmp] 34 bmp Vt Set:  [420 mL] 420 mL PEEP:  [5 cmH20-14 cmH20] 12 cmH20 Plateau Pressure:  [24 cmH20-30 cmH20] 27 cmH20   Intake/Output Summary (Last 24 hours) at 10/30/2020 0646 Last data filed at 10/30/2020 0600 Gross per 24 hour  Intake 4577.54 ml  Output 740 ml  Net 3837.54 ml   Filed Weights   10/28/20 0500 10/29/20 0444 10/30/20 0500  Weight: 83.9 kg 86.5 kg 86.5 kg    Examination: Constitutional: Critically ill middle-age gentleman who remains comatose Eyes: Upward gaze, pupils very sluggish, scleral icterus bilaterally Ears, nose, mouth, and throat: ETT in place Cardiovascular: Tachycardic, no murmurs, gallops, rubs Respiratory: Breath sounds present, no crackles, rhonchi Gastrointestinal: Bowel sounds present Skin: Decreased oozing of blood from left IJ central catheter site Neurologic: Remains comatose, positive gag (change from yesterday), no corneal reflex, positive doll's eye, unable to ascertain motor strength, does not withdrawal or flex to painful stimuli  Resolved Hospital Problem list     Assessment & Plan:  OOH Cardiac arrest- suspicion here is EtOH withdrawal leading to seizure/aspiration event.  This aspiration led to ARDS, decompensated alcoholic cirrhosis, coagulopathy, and distributive shock.    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   -With ARDS, continue LTVV, 4-8cc/kg IBW with goal Pplat <30 and DP<15.  -Continue Zosyn -Increase lactulose and add rifaximin to target 2-3 bowel movements per day  *Continue to  follow-up ammonia level -For seizures, appreciate neurology recommendation and continue Keppra -F/u MRI brain brain -Closely monitor coagulation parameter  *Transfuse platelet if <20,000  *Other options for managing his DIC include 45F-PCC, Vit K, FFP given cirrhosis and  possible volume overload  *Cryoprecipitate if fibrinogen <100 -Continue Giapreza, stress dose steroids, Levophed, vasopressin for hemodynamic support with goal of weaning -For his oliguric renal failure, will consult nephrology for CRRT if MRI brain does not reveal an anoxic brain injury. Will also support with albumin and octreotide if necessary -Decreased D10 infusion to 10 mL/hour to target BG 140-180  Best practice (evaluated daily)  Diet: NPO Pain/Anxiety/Delirium protocol (if indicated): Propofol/as needed fentanyl VAP protocol (if indicated): Yes DVT prophylaxis: SCDs GI prophylaxis: Protonix Glucose control: CBG monitoring Mobility: Bedrest Disposition: ICU  Goals of Care:  Continue to assess daily.  Family very supportive and understanding given his grim prognosis.   Jodelle Red, MD PGY-3 Internal Medicine Teaching Service

## 2020-10-30 NOTE — Procedures (Addendum)
Patient Name:Logan Shaw YPP:[id number] Epilepsy Attending:Diavian Furgason Annabelle Harman Referring Physician/Provider:Dr Levon Hedger Duration:10/29/2020 1010 to 10/30/2020 1041  Patient history:44yo m s/p cardiac arrrest. EEG to evaluate for seizure  Level of alertness:comatose  AEDs during EEG study:LEV  Technical aspects: This EEG study was done with scalp electrodes positioned according to the 10-20 International system of electrode placement. Electrical activity was acquired at a sampling rate of 500Hz  and reviewed with a high frequency filter of 70Hz  and a low frequency filter of 1Hz . EEG data were recorded continuously and digitally stored.   Description:EEG initially showed generalized suppression with generalized spikes every 3-7 seconds.   After around 2230 on 10/29/2020 EEG showed  generalized suppression. EEG was not reactive to any tactile stimulation.  ABNORMALITY - Spikes, generalized -Background suppression, generalized  IMPRESSION: This study initially showed evidence ofgeneralizedepileptogenicity.  After around 2230 on 10/29/2020,EEG was suggestive of profound diffuse encephalopathy, nonspecific etiology.No seizures were seen throughout the recording.  Amal Renbarger 10/31/2020

## 2020-10-30 NOTE — Progress Notes (Signed)
eLink Physician-Brief Progress Note Patient Name: Logan Shaw DOB: November 28, 1975 MRN: [id number]   Date of Service  10/30/2020  HPI/Events of Note  Patient started on D 10 % water gtt  at 60 ml yesterday for hypoglycemia, blood sugar now 180-220. Patient also needs follow up CXR for aspiration pneumonia.  eICU Interventions  D 10 gtt reduced to 20 ml /hour, portable CXR ordered.        Thomasene Lot Ogan 10/30/2020, 4:39 AM

## 2020-10-30 NOTE — Progress Notes (Signed)
LTM maint complete - no skin breakdown under: Fp1 F3 F8

## 2020-10-30 NOTE — Progress Notes (Signed)
eLink Physician-Brief Progress Note Patient Name: Logan Shaw DOB: 01-Feb-1976 MRN: [id number]   Date of Service  10/30/2020  HPI/Events of Note  Patient is on Giapreza and the order has expired.  eICU Interventions  Giapreza order renewed.        Thomasene Lot Shavona Gunderman 10/30/2020, 10:13 PM

## 2020-10-30 NOTE — Progress Notes (Signed)
eLink Physician-Brief Progress Note Patient Name: Logan Shaw DOB: [birth year] MRN: [id number]   Date of Service  10/30/2020  HPI/Events of Note  INR 5.2, no overt bleeding. Ammonia level 170.  eICU Interventions  Lactulose increased to 30 gm via tube tid, Vitamin K 5 mg iv daily x 2 days.        Migdalia Dk 10/30/2020, 6:34 AM

## 2020-10-31 ENCOUNTER — Inpatient Hospital Stay (HOSPITAL_COMMUNITY): Payer: 59

## 2020-10-31 DIAGNOSIS — R092 Respiratory arrest: Secondary | ICD-10-CM | POA: Diagnosis not present

## 2020-10-31 DIAGNOSIS — I469 Cardiac arrest, cause unspecified: Secondary | ICD-10-CM | POA: Diagnosis not present

## 2020-10-31 LAB — CBC
HCT: 24.8 % — ABNORMAL LOW (ref 39.0–52.0)
Hemoglobin: 8.7 g/dL — ABNORMAL LOW (ref 13.0–17.0)
MCH: 39.4 pg — ABNORMAL HIGH (ref 26.0–34.0)
MCHC: 35.1 g/dL (ref 30.0–36.0)
MCV: 112.2 fL — ABNORMAL HIGH (ref 80.0–100.0)
Platelets: 38 10*3/uL — ABNORMAL LOW (ref 150–400)
RBC: 2.21 MIL/uL — ABNORMAL LOW (ref 4.22–5.81)
RDW: 17.9 % — ABNORMAL HIGH (ref 11.5–15.5)
WBC: 22.9 10*3/uL — ABNORMAL HIGH (ref 4.0–10.5)
nRBC: 3.5 % — ABNORMAL HIGH (ref 0.0–0.2)

## 2020-10-31 LAB — RENAL FUNCTION PANEL
Albumin: 2 g/dL — ABNORMAL LOW (ref 3.5–5.0)
Anion gap: 19 — ABNORMAL HIGH (ref 5–15)
BUN: 49 mg/dL — ABNORMAL HIGH (ref 6–20)
CO2: 17 mmol/L — ABNORMAL LOW (ref 22–32)
Calcium: 7.7 mg/dL — ABNORMAL LOW (ref 8.9–10.3)
Chloride: 100 mmol/L (ref 98–111)
Creatinine, Ser: 4.06 mg/dL — ABNORMAL HIGH (ref 0.61–1.24)
GFR, Estimated: 18 mL/min — ABNORMAL LOW (ref >60)
Glucose, Bld: 76 mg/dL (ref 70–99)
Phosphorus: 7.1 mg/dL — ABNORMAL HIGH (ref 2.5–4.6)
Potassium: 4.9 mmol/L (ref 3.5–5.1)
Sodium: 136 mmol/L (ref 135–145)

## 2020-10-31 LAB — COMPREHENSIVE METABOLIC PANEL
ALT: 539 U/L — ABNORMAL HIGH (ref 0–44)
AST: 982 U/L — ABNORMAL HIGH (ref 15–41)
Albumin: 2 g/dL — ABNORMAL LOW (ref 3.5–5.0)
Alkaline Phosphatase: 63 U/L (ref 38–126)
Anion gap: 16 — ABNORMAL HIGH (ref 5–15)
BUN: 49 mg/dL — ABNORMAL HIGH (ref 6–20)
CO2: 21 mmol/L — ABNORMAL LOW (ref 22–32)
Calcium: 7.7 mg/dL — ABNORMAL LOW (ref 8.9–10.3)
Chloride: 97 mmol/L — ABNORMAL LOW (ref 98–111)
Creatinine, Ser: 4.27 mg/dL — ABNORMAL HIGH (ref 0.61–1.24)
GFR, Estimated: 17 mL/min — ABNORMAL LOW (ref >60)
Glucose, Bld: 118 mg/dL — ABNORMAL HIGH (ref 70–99)
Potassium: 4.2 mmol/L (ref 3.5–5.1)
Sodium: 134 mmol/L — ABNORMAL LOW (ref 135–145)
Total Bilirubin: 15.2 mg/dL — ABNORMAL HIGH (ref 0.3–1.2)
Total Protein: 6.1 g/dL — ABNORMAL LOW (ref 6.5–8.1)

## 2020-10-31 LAB — MAGNESIUM: Magnesium: 2.4 mg/dL (ref 1.7–2.4)

## 2020-10-31 LAB — PHOSPHORUS: Phosphorus: 6.3 mg/dL — ABNORMAL HIGH (ref 2.5–4.6)

## 2020-10-31 LAB — GLUCOSE, CAPILLARY
Glucose-Capillary: 105 mg/dL — ABNORMAL HIGH (ref 70–99)
Glucose-Capillary: 106 mg/dL — ABNORMAL HIGH (ref 70–99)
Glucose-Capillary: 124 mg/dL — ABNORMAL HIGH (ref 70–99)
Glucose-Capillary: 72 mg/dL (ref 70–99)
Glucose-Capillary: 86 mg/dL (ref 70–99)

## 2020-10-31 LAB — TRIGLYCERIDES: Triglycerides: 99 mg/dL (ref <150)

## 2020-10-31 LAB — PROTIME-INR
INR: 5.6 (ref 0.8–1.2)
Prothrombin Time: 49.3 seconds — ABNORMAL HIGH (ref 11.4–15.2)

## 2020-10-31 LAB — PATHOLOGIST SMEAR REVIEW

## 2020-10-31 LAB — AMMONIA: Ammonia: 198 umol/L — ABNORMAL HIGH (ref 9–35)

## 2020-10-31 MED ORDER — PIPERACILLIN-TAZOBACTAM 3.375 G IVPB: 3.3750 g | Freq: Four times a day (QID) | INTRAVENOUS | Status: DC

## 2020-10-31 MED ORDER — PRISMASOL BGK 4/2.5 32-4-2.5 MEQ/L EC SOLN: Status: DC

## 2020-10-31 MED ORDER — PROSOURCE TF PO LIQD
45.0000 mL | Freq: Three times a day (TID) | ORAL | Status: DC
  Administered 2020-10-31 – 2020-11-01 (×4): 45 mL
  Filled 2020-10-31 (×4): qty 45

## 2020-10-31 MED ORDER — PRISMASOL BGK 4/2.5 32-4-2.5 MEQ/L REPLACEMENT SOLN: Status: DC

## 2020-10-31 MED ORDER — ETOMIDATE 2 MG/ML IV SOLN
20.0000 mg | Freq: Once | INTRAVENOUS | Status: AC | PRN
  Administered 2020-10-31: 20 mg via INTRAVENOUS
  Filled 2020-10-31 (×2): qty 10

## 2020-10-31 MED ORDER — VITAL AF 1.2 CAL PO LIQD
1000.0000 mL | ORAL | Status: DC
  Administered 2020-11-01: 1000 mL

## 2020-10-31 MED ORDER — HEPARIN (PORCINE) 2000 UNITS/L FOR CRRT
INTRAVENOUS_CENTRAL | Status: DC | PRN
  Filled 2020-10-31 (×2): qty 1000

## 2020-10-31 MED ORDER — VITAMIN K1 10 MG/ML IJ SOLN
10.0000 mg | Freq: Once | INTRAVENOUS | Status: AC
  Administered 2020-10-31: 10 mg via INTRAVENOUS
  Filled 2020-10-31: qty 1

## 2020-10-31 MED ORDER — PIPERACILLIN-TAZOBACTAM 3.375 G IVPB 30 MIN
3.3750 g | Freq: Four times a day (QID) | INTRAVENOUS | Status: DC
  Administered 2020-10-31 – 2020-11-01 (×3): 3.375 g via INTRAVENOUS
  Filled 2020-10-31 (×7): qty 50

## 2020-10-31 MED ORDER — DEXTROSE 50 % IV SOLN: 12.5000 g | INTRAVENOUS | Status: AC

## 2020-10-31 MED ORDER — SUCCINYLCHOLINE CHLORIDE 20 MG/ML IJ SOLN
89.0000 mg | Freq: Once | INTRAMUSCULAR | Status: DC | PRN
  Filled 2020-10-31 (×2): qty 4.45

## 2020-10-31 MED ORDER — HEPARIN SODIUM (PORCINE) 1000 UNIT/ML DIALYSIS
1000.0000 [IU] | INTRAMUSCULAR | Status: DC | PRN
  Administered 2020-11-01: 2400 [IU] via INTRAVENOUS_CENTRAL
  Filled 2020-10-31 (×2): qty 6

## 2020-10-31 MED ORDER — HEPARIN SODIUM (PORCINE) 1000 UNIT/ML IJ SOLN: 2.4000 mL | Freq: Once | INTRAMUSCULAR | Status: DC

## 2020-10-31 MED ORDER — DEXTROSE 50 % IV SOLN
INTRAVENOUS | Status: AC
  Administered 2020-10-31: 12.5 g via INTRAVENOUS
  Filled 2020-10-31: qty 50

## 2020-10-31 NOTE — Progress Notes (Signed)
RT note-Patient taken to CT, placed on 100%, paralytic given by RN for tolerance and was able to get the scan done today. Patient remains on current setting with fio2 at 70%,

## 2020-10-31 NOTE — Progress Notes (Signed)
Hypoglycemic Event  CBG: 69  Treatment: D50 25 mL (12.5 gm)  Symptoms: None  Follow-up CBG: Time: 2009 CBG Result:106  Possible Reasons for Event: Inadequate meal intake  Comments/MD notified: n/a    Saagar Tortorella

## 2020-10-31 NOTE — Progress Notes (Signed)
Initial Nutrition Assessment  DOCUMENTATION CODES:   Not applicable  INTERVENTION:   Tube Feeding via OG:  Vital AF 1.2 at 60 Pro-Source TF 45 mL TID Provides 130 g of protein, 1848 kcals and 1166 mL of free water  NUTRITION DIAGNOSIS:   Inadequate oral intake related to acute illness as evidenced by NPO status. GOAL:   Patient will meet greater than or equal to 90% of their needs  MONITOR:   Vent status,TF tolerance,Labs,Weight trends,Skin  REASON FOR ASSESSMENT:   Consult,Ventilator Enteral/tube feeding initiation and management  ASSESSMENT:   45 yo male admitted post OOH cardiac arrest, possible DT-induced aspiration and ARDS requiring intubation, newly diagnosed EtOH cirrhosis and in profound shock from decompensated liver failure   Pt remains on vent support, intermittent vent dyssynchrony. Comatose off sedation. Requiring levophed and vasopressin  Noted plan to initiate CRRT today Hyperphosphatemia should improve with initiation of CRRT  Vital High Protein at 20 ml/hr via OG  Current wt 89.2 kg; diffuse anasarca. Admit wt 82.2 kg. Net +15 L   Labs: phosphorus 6.3 (H), sodium 134 (L), ammonia 198 (H) Meds: lactulose, xifaxan    Diet Order:   Diet Order            Diet NPO time specified  Diet effective now                 EDUCATION NEEDS:   Not appropriate for education at this time  Skin:  Skin Assessment: Reviewed RN Assessment  Last BM:  3/16  Height:   Ht Readings from Last 1 Encounters:  11-16-2020 5\' 4"  (1.626 m)    Weight:   Wt Readings from Last 1 Encounters:  10/31/20 89.2 kg    BMI:  Body mass index is 33.76 kg/m.  Estimated Nutritional Needs:   Kcal:  1900-2200 kcals  Protein:  120-150 g  Fluid:  >/= 1.5 L   11/02/20 MS, RDN, LDN, CNSC Registered Dietitian III Clinical Nutrition RD Pager and On-Call Pager Number Located in [place]

## 2020-10-31 NOTE — Procedures (Signed)
Central Venous Catheter Insertion Procedure Note  Logan Shaw  [id number]  1976/06/21  Date:10/31/20  Time:1:14 PM   Provider Performing:Buren Havey Judie Petit Pecola Leisure   Procedure: Insertion of Non-tunneled Central Venous Catheter(36556)with US guidance (82956)    Indication(s) Medication administration and Hemodialysis  Consent Risks of the procedure as well as the alternatives and risks of each were explained to the patient and/or caregiver.  Consent for the procedure was obtained and is signed in the bedside chart  Anesthesia Topical only with 1% lidocaine   Timeout Verified patient identification, verified procedure, site/side was marked, verified correct patient position, special equipment/implants available, medications/allergies/relevant history reviewed, required imaging and test results available.  Sterile Technique Maximal sterile technique including full sterile barrier drape, hand hygiene, sterile gown, sterile gloves, mask, hair covering, sterile ultrasound probe cover (if used).       Procedure Description Area of catheter insertion was cleaned with chlorhexidine and draped in sterile fashion.   With real-time ultrasound guidance a HD catheter was placed into the right internal jugular vein.  Nonpulsatile blood flow and easy flushing noted in all ports.  The catheter was sutured in place and sterile dressing applied.  Complications/Tolerance None; patient tolerated the procedure well. Chest X-ray is ordered to verify placement for internal jugular or subclavian cannulation.  Chest x-ray is not ordered for femoral cannulation.  EBL Minimal  Specimen(s) None  The entire procedure was supervised/proctored by Joneen Roach, NP.  Tim Lair, PA-C [facility] 10/31/20 1:14 PM   Please see [email or web address] for pager details.

## 2020-10-31 NOTE — Progress Notes (Signed)
Subjective: No significant changes  Exam: Vitals:   10/31/20 0500 10/31/20 0600  BP:    Pulse: (!) 108 (!) 106  Resp: (!) 27 (!) 26  Temp: 98.06 F (36.7 C) 98.06 F (36.7 C)  SpO2: 100% 100%   Gen: In bed, intubated Resp: Ventilated Abd: soft, nt  Neuro: MS: Does not open eyes or follow commands CN: Pupils equal round and reactive, corneals were absent today, was unable to get a reaction on the right, no reaction to doll's eye maneuver.  Cough is absent.  Motor: No movement to noxious stimulation Sensory: As above   Pertinent Labs: Ammonia 170->198  Impression: 45 year old male who presents with presumed aspiration due to DTs.  Now with ARDS and severe shock.  His EEG is quite concerning in the post anoxic arrest setting.  His severe hyperammonemia is a potential confounding factor, and I would favor continuing to treat this. Imaging could be helpful as if it shows evidence of hypoxic brian injury, then may help with family discussions.   Recommendations: 1) continue levetiracetam 1g BID 2) plan for repeat imaging today  Ritta Slot, MD [facility] [phone]  If 7pm- 7am, please page neurology on call as listed in AMION. 10/31/2020  7:41 AM

## 2020-10-31 NOTE — Progress Notes (Signed)
eLink Physician-Brief Progress Note Patient Name: Agustine Rossitto DOB: [birth year] MRN: [id number]   Date of Service  10/31/2020  HPI/Events of Note  INR 5.6, no evidence of overt hemorrhage.  eICU Interventions  Vitamin K 10 mg iv x 1.        Thomasene Lot Clotiel Troop 10/31/2020, 6:29 AM

## 2020-10-31 NOTE — Progress Notes (Signed)
Assumed care of pt at 1230 for initiation of CRRT. HD line placed at bedside, CRRT started, slight increase in pressor requirements.   N: UTA orientation, pupils 62m and brisk, no withdraw to pain, pt family reports seeing eyelid flutter and occular movement, no corneal reflex, no gag, moderate cough with deep suction. Afebrile, utilizing prismaflex warmer. Gave 1 dose prn fentanyl bolus for increased BP and tachypnea with vent dyssynchrone. Sedated with fentany gtt.    R: ETT 70% + 12, RR 34, TV 420. Ronchous lung sounds. Scant secretions.   CV: ST, no ectopy. MAP goal greater than 65 met with levophed and vasopressin. Increase in levophed needs after initiation of CRRT. Pale and cool throughout, palpable pulses 1+.   GI: OGT with tube feeds running, increased to new goal per order. Hypoactive tones, family report of gas passed, no BM. BG WDL.   GU: foley in place, scant UOP. Urine dark tea, sediment. CRRT initiated at 1330 without issue. Goal -200-309mhr met. Ending shift -80049m  Skin: Skin intact, generalized and scattered bruising.   Wife and brother at bedside and supportive of patient and care.   Leathia Farnell RN

## 2020-10-31 NOTE — Consult Note (Signed)
Reason for Consult: Acute kidney injury, extracorporeal removal of ammonia Referring Physician: Ina Homes, MD (CCM)  HPI: History obtained from chart review/wife 45 year old Caucasian man with past medical history significant for alcohol abuse, alcoholic liver cirrhosis and prior history of GI bleed who was brought to the emergency room 4 days ago after suffering a witnessed cardiac arrest with ROSC in 12 minutes.  He was consequently found to have distributive versus septic shock and ARDS along with upper GI bleed and decompensated cirrhosis.  He developed acute kidney injury and has had persistently elevated ammonia levels with encephalopathy.  Per his wife, he does not have history of chronic kidney disease and has a history of nephrolithiasis.  His urine output has been progressively decreasing with attendant rise of BUN/creatinine.  He has been weaned off of pressors and has not had any iodinated intravenous contrast exposure.  Past Medical History:  Diagnosis Date  . Liver disease, chronic, with cirrhosis ([place])     No family history on file.  Social History:  has no history on file for tobacco use, alcohol use, and drug use.  Allergies: No Known Allergies  Medications:  Scheduled: . sodium chloride   Intravenous Once  . sodium chloride   Intravenous Once  . artificial tears  1 application Both Eyes P2R  . chlorhexidine gluconate (MEDLINE KIT)  15 mL Mouth Rinse BID  . Chlorhexidine Gluconate Cloth  6 each Topical Daily  . feeding supplement (VITAL HIGH PROTEIN)  1,000 mL Per Tube Q24H  . folic acid  1 mg Intravenous Daily  . hydrocortisone sod succinate (SOLU-CORTEF) inj  50 mg Intravenous Q12H  . lactulose  30 g Per Tube TID  . mouth rinse  15 mL Mouth Rinse 10 times per day  . nicotine  21 mg Transdermal Daily  . pantoprazole  40 mg Intravenous Q12H  . rifaximin  550 mg Per Tube BID  . sodium chloride flush  10-40 mL Intracatheter Q12H    BMP Latest Ref Rng & Units  10/31/2020 10/30/2020 10/29/2020  Glucose 70 - 99 mg/dL 118(H) 223(H) 120(H)  BUN 6 - 20 mg/dL 49(H) 34(H) 25(H)  Creatinine 0.61 - 1.24 mg/dL 4.27(H) 3.16(H) 2.32(H)  Sodium 135 - 145 mmol/L 134(L) 132(L) 133(L)  Potassium 3.5 - 5.1 mmol/L 4.2 4.3 4.4  Chloride 98 - 111 mmol/L 97(L) 95(L) 94(L)  CO2 22 - 32 mmol/L 21(L) 22 18(L)  Calcium 8.9 - 10.3 mg/dL 7.7(L) 7.5(L) 7.7(L)   CBC Latest Ref Rng & Units 10/31/2020 10/30/2020 10/29/2020  WBC 4.0 - 10.5 K/uL 22.9(H) 24.7(H) -  Hemoglobin 13.0 - 17.0 g/dL 8.7(L) 8.8(L) -  Hematocrit 39.0 - 52.0 % 24.8(L) 26.2(L) -  Platelets 150 - 400 K/uL 38(L) 46(L) 47(L)     DG Abd 1 View  Result Date: 10/31/2020 CLINICAL DATA:  Decreased bowel sounds EXAM: ABDOMEN - 1 VIEW COMPARISON:  None. FINDINGS: Nasogastric tube tip and side port in stomach. There is no bowel dilatation or air-fluid level to suggest bowel obstruction. No free air on supine examination. No abnormal calcifications. IMPRESSION: No findings indicative of bowel obstruction or free air. Nasogastric tube tip and side port in stomach. Electronically Signed   By: Lowella Grip III M.D.   On: 10/31/2020 08:27   CT HEAD WO CONTRAST  Result Date: 10/31/2020 CLINICAL DATA:  Altered mental status following cardiac arrest EXAM: CT HEAD WITHOUT CONTRAST TECHNIQUE: Contiguous axial images were obtained from the base of the skull through the vertex without intravenous contrast.  COMPARISON:  October 27, 2020 FINDINGS: Brain: Ventricles and sulci are normal in size and configuration. There is no appreciable intracranial mass, hemorrhage, extra-axial fluid collection, or midline shift. There is preservation gray-white differentiation. No edema evident. No findings indicative of acute infarct. Vascular: No hyperdense vessel.  No evident vascular calcification. Skull: The bony calvarium appears intact. Sinuses/Orbits: There is mucosal thickening in the left sphenoid sinus. 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 Other  visualized paranasal sinuses are clear. Visualized orbits appear symmetric bilaterally. Other: There is diffuse opacification of most mastoid air cells bilaterally. IMPRESSION: 1. No lesion involving brain parenchyma evident. There is preservation of gray-white differentiation without evident edema or focal infarct. No mass or hemorrhage. 2.  Opacification of most mastoid air cells bilaterally. 3.  Left sphenoid paranasal sinus disease. Electronically Signed   By: Lowella Grip III M.D.   On: 10/31/2020 09:54   DG Chest Port 1 View  Result Date: 10/30/2020 CLINICAL DATA:  Cardiac arrest.  Acute respiratory failure. EXAM: PORTABLE CHEST 1 VIEW COMPARISON:  Chest x-ray 10/26/2020 FINDINGS: Endotracheal tube in stable position. Left internal jugular central venous catheter in stable position. Enteric tube is again noted to course below the hemidiaphragm with tip and side port collimated off view. The heart size and mediastinal contours are within normal limits. Low lung volumes with persistent patchy airspace opacities no pleural effusion. No pneumothorax. No acute osseous abnormality. IMPRESSION: 1. Low lung volumes with persistent patchy airspace opacities. 2. Lines and tubes in stable position. Electronically Signed   By: Iven Finn M.D.   On: 10/30/2020 06:04    Review of Systems  Unable to perform ROS: Intubated   Blood pressure (!) 112/58, pulse (!) 113, temperature 98.24 F (36.8 C), temperature source Bladder, resp. rate (!) 26, height 5' 4"  (1.626 m), weight 89.2 kg, SpO2 94 %. Physical Exam Vitals and nursing note reviewed.  Constitutional:      Appearance: He is ill-appearing.     Comments: Intubated, sedated  HENT:     Head: Normocephalic and atraumatic.     Comments: Intubated    Right Ear: External ear normal.     Left Ear: External ear normal.     Nose: Nose normal.  Eyes:     General: Scleral icterus present.     Conjunctiva/sclera: Conjunctivae normal.   Cardiovascular:     Rate and Rhythm: Regular rhythm. Tachycardia present.     Heart sounds: Murmur heard.      Comments: Ejection systolic murmur over apex Pulmonary:     Breath sounds: Normal breath sounds. No wheezing, rhonchi or rales.  Abdominal:     General: There is distension.     Palpations: Abdomen is soft.     Tenderness: There is no abdominal tenderness. There is no guarding.  Musculoskeletal:     Cervical back: Neck supple.     Right lower leg: Edema present.     Left lower leg: Edema present.     Comments: Anasarca noted  Skin:    General: Skin is warm.     Coloration: Skin is jaundiced and pale.     Assessment/Plan: 1.  Acute kidney injury: Likely ATN that is multifactorial.  Decreasing urine output with rising BUN/creatinine in the setting of encephalopathy.  Initiate CRRT today after placement of dialysis catheter by critical care service.  Will aim to keep him net -200 to 300 cc/h for volume unloading. 2.  Status post cardiac arrest: With ventilator dependent respiratory failure and evidence of  hypoxic encephalopathy.  Undertake hemodialysis for volume unloading. 3.  Alcoholic liver cirrhosis: Complicated by recent upper GI bleed and significantly elevated ammonia levels but continued to rise with medical management.  Initiate CRRT for extracorporeal removal of ammonia. 4.  Anemia: Likely secondary to chronic illness and complicated by recent GI bleed, monitor trend.  Prentice Sackrider K. 10/31/2020, 10:50 AM

## 2020-10-31 NOTE — Progress Notes (Signed)
This is a progress note   NAME:  Logan Shaw, MRN:  [id number], DOB:  [birth year], LOS: 4 ADMISSION DATE:  2020/11/24, CONSULTATION DATE: Nov 24, 2020 REFERRING MD: Olivia Canter, CHIEF COMPLAINT: Status post cardiac arrest  Brief History:  45 year old male with with alcohol abuse, recently diagnosed alcholic liver disease question cirrhosis who presented status post cardiac arrest, witnessed by his wife and bystander, ROSC achieved after 12 minutes.  Also discovered to be in ARDS and septic shock.  History of Present Illness:  45 year old male with history of alcohol abuse, alcoholic liver cirrhosis and prior history of GI bleeding who presented status post cardiac arrest this evening. Most of the history is taken from medical record as patient is intubated, unresponsive and sedated.  As per patient's wife he was in his usual state of health until about 2 days ago when he started feeling generalized weakness, he was seen at GI clinic for elevated LFTs, he was not feeling well whole day today, later on wife checked on him he was found unresponsive, they started CPR and EMS was summoned, ROSC was achieved after 12 minutes of CPR.  King's airway was placed and patient was brought into the emergency department.  In the emergency department King's airway was changed to ET tube, patient was noted to have bleeding from ETT and OGT.  Past Medical History:   Past Medical History:  Diagnosis Date  . Liver disease, chronic, with cirrhosis (HCC)      Significant Hospital Events:  Admit 3/12  Consults:  PCCM  Procedures:  ETT 3/12 >  Significant Diagnostic Tests:  X-ray chest: Reviewed by myself showing bilateral airspace disease consistent with ARDS\ 3/12 CT head: Ethmoid sinus disease. Otherwise no acute intracranial process.  Micro Data:  3/12 Blood Cx: NGTD  Antimicrobials:  Vancomycin 3/12 >>3/13 Zosyn 3/12 >>  Interim History / Subjective:  Intubated. Wife is present at bedside and  very supportive. She has been updated about the plans today.   Objective   Blood pressure (!) 112/58, pulse (!) 106, temperature 98.06 F (36.7 C), resp. rate (!) 26, height 5\' 4"  (1.626 m), weight 89.2 kg, SpO2 100 %.    Vent Mode: PRVC FiO2 (%):  [50 %] 50 % Set Rate:  [34 bmp] 34 bmp Vt Set:  [420 mL] 420 mL PEEP:  [12 cmH20] 12 cmH20 Plateau Pressure:  [28 cmH20-32 cmH20] 32 cmH20   Intake/Output Summary (Last 24 hours) at 10/31/2020 11/02/2020 Last data filed at 10/31/2020 0600 Gross per 24 hour  Intake 1912.15 ml  Output 685 ml  Net 1227.15 ml   Filed Weights   10/29/20 0444 10/30/20 0500 10/31/20 0323  Weight: 86.5 kg 86.5 kg 89.2 kg    Examination: Constitutional: Critically ill middle-age gentleman, comatose Eyes: Bilateral scleral icterus, conjunctival injection, unable to track but eyes will spontaneously ABduct/ADduct when had is moved Ears, nose, mouth, and throat: ETT in place Cardiovascular: Tachycardic, no murmurs, gallops, rubs Respiratory: Breath sounds present, no crackles, rhonchi Gastrointestinal: Absent/decreased bowel sounds Skin: Decreased oozing of blood from left IJ central catheter site Neurologic: Remains comatose,no corneal reflex, no gag reflex, motor function unable to be determined at this point  Resolved Hospital Problem list     Assessment & Plan:  Assessment: OOH Cardiac arrest- suspicion here is EtOH withdrawal leading to seizure/aspiration event.  This aspiration led to ARDS, decompensated alcoholic cirrhosis, alcoholic hepatitis with Maddrey's Discriminant score of 191, coagulopathy, and distributive shock.    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  -Continue Zosyn to complete 7 day course -Continue lactulose and rifaximin   *Continue to follow-up ammonia level -F/u KUB due to decreased bowel sounds. Might require neostigmine for ileus  -For seizures, appreciate neurology recommendation and continue Keppra -F/u CT brain as he decompensated during MRI yesterday  -Closely monitor coagulation parameter  *Transfuse platelet if <20,000  *Other options for managing his coagulopathy/DIC include 50F-PCC, Vit K, FFP given                 cirrhosis and possible volume overload  *Cryoprecipitate if fibrinogen <100 -Continue Giapreza, stress dose steroids, vasopressin for hemodynamic support with goal of weaning. Now off Levophed  -For his oliguric renal failure and hyperammonemia, will consult nephrology for CRRT. Will also support with albumin and octreotide if necessary.   -Might require transitioning Solu-Cortef to prednisolone for his alcoholic hepatitis  Best practice (evaluated daily)  Diet: NPO Pain/Anxiety/Delirium protocol (if indicated): Propofol/as needed fentanyl VAP protocol (if indicated): Yes DVT prophylaxis: SCDs GI prophylaxis: Protonix Glucose control: CBG monitoring Mobility: Bedrest Disposition: ICU  Goals of Care:  Continue to assess daily.  Family very supportive and understanding given his grim prognosis.   Jodelle Red, MD PGY-3 Internal Medicine Teaching Service

## 2020-11-01 DIAGNOSIS — J9601 Acute respiratory failure with hypoxia: Secondary | ICD-10-CM

## 2020-11-01 DIAGNOSIS — Z7189 Other specified counseling: Secondary | ICD-10-CM

## 2020-11-01 DIAGNOSIS — Z515 Encounter for palliative care: Secondary | ICD-10-CM

## 2020-11-01 LAB — GLUCOSE, CAPILLARY
Glucose-Capillary: 106 mg/dL — ABNORMAL HIGH (ref 70–99)
Glucose-Capillary: 106 mg/dL — ABNORMAL HIGH (ref 70–99)
Glucose-Capillary: 59 mg/dL — ABNORMAL LOW (ref 70–99)
Glucose-Capillary: 69 mg/dL — ABNORMAL LOW (ref 70–99)
Glucose-Capillary: 86 mg/dL (ref 70–99)
Glucose-Capillary: 77 mg/dL (ref 70–99)
Glucose-Capillary: 161 mg/dL — ABNORMAL HIGH (ref 70–99)
Glucose-Capillary: 174 mg/dL — ABNORMAL HIGH (ref 70–99)

## 2020-11-01 LAB — COMPREHENSIVE METABOLIC PANEL
ALT: 609 U/L — ABNORMAL HIGH (ref 0–44)
AST: 817 U/L — ABNORMAL HIGH (ref 15–41)
Albumin: 2.2 g/dL — ABNORMAL LOW (ref 3.5–5.0)
Alkaline Phosphatase: 78 U/L (ref 38–126)
Anion gap: 20 — ABNORMAL HIGH (ref 5–15)
BUN: 37 mg/dL — ABNORMAL HIGH (ref 6–20)
CO2: 16 mmol/L — ABNORMAL LOW (ref 22–32)
Calcium: 8.1 mg/dL — ABNORMAL LOW (ref 8.9–10.3)
Chloride: 99 mmol/L (ref 98–111)
Creatinine, Ser: 3.33 mg/dL — ABNORMAL HIGH (ref 0.61–1.24)
GFR, Estimated: 23 mL/min — ABNORMAL LOW (ref >60)
Glucose, Bld: 66 mg/dL — ABNORMAL LOW (ref 70–99)
Potassium: 5 mmol/L (ref 3.5–5.1)
Sodium: 135 mmol/L (ref 135–145)
Total Bilirubin: 20.4 mg/dL (ref 0.3–1.2)
Total Protein: 7.1 g/dL (ref 6.5–8.1)

## 2020-11-01 LAB — CULTURE, BLOOD (ROUTINE X 2)
Culture: NO GROWTH
Culture: NO GROWTH
Special Requests: ADEQUATE

## 2020-11-01 LAB — AMMONIA: Ammonia: 204 umol/L — ABNORMAL HIGH (ref 9–35)

## 2020-11-01 LAB — CBC
HCT: 28.5 % — ABNORMAL LOW (ref 39.0–52.0)
Hemoglobin: 9.8 g/dL — ABNORMAL LOW (ref 13.0–17.0)
MCH: 39 pg — ABNORMAL HIGH (ref 26.0–34.0)
MCHC: 34.4 g/dL (ref 30.0–36.0)
MCV: 113.5 fL — ABNORMAL HIGH (ref 80.0–100.0)
Platelets: 39 10*3/uL — ABNORMAL LOW (ref 150–400)
RBC: 2.51 MIL/uL — ABNORMAL LOW (ref 4.22–5.81)
RDW: 18.8 % — ABNORMAL HIGH (ref 11.5–15.5)
WBC: 19.7 10*3/uL — ABNORMAL HIGH (ref 4.0–10.5)
nRBC: 5.8 % — ABNORMAL HIGH (ref 0.0–0.2)

## 2020-11-01 LAB — PROTIME-INR
INR: 7.1 (ref 0.8–1.2)
Prothrombin Time: 59.1 seconds — ABNORMAL HIGH (ref 11.4–15.2)

## 2020-11-01 LAB — MAGNESIUM: Magnesium: 2.5 mg/dL — ABNORMAL HIGH (ref 1.7–2.4)

## 2020-11-01 LAB — VITAMIN B12: Vitamin B-12: 6007 pg/mL — ABNORMAL HIGH (ref 180–914)

## 2020-11-01 LAB — PHOSPHORUS: Phosphorus: 6.8 mg/dL — ABNORMAL HIGH (ref 2.5–4.6)

## 2020-11-01 MED ORDER — GLYCOPYRROLATE 0.2 MG/ML IJ SOLN
0.2000 mg | INTRAMUSCULAR | Status: DC | PRN
  Administered 2020-11-01: 0.2 mg via INTRAVENOUS
  Filled 2020-11-01: qty 1

## 2020-11-01 MED ORDER — GLYCOPYRROLATE 0.2 MG/ML IJ SOLN: 0.2000 mg | INTRAMUSCULAR | Status: DC | PRN

## 2020-11-01 MED ORDER — LORAZEPAM 2 MG/ML IJ SOLN
2.0000 mg | INTRAMUSCULAR | Status: DC
  Administered 2020-11-01: 2 mg via INTRAVENOUS
  Filled 2020-11-01: qty 1

## 2020-11-01 MED ORDER — SODIUM BICARBONATE 8.4 % IV SOLN
INTRAVENOUS | Status: DC
  Filled 2020-11-01: qty 50

## 2020-11-01 MED ORDER — DEXTROSE 50 % IV SOLN
12.5000 g | INTRAVENOUS | Status: AC
  Administered 2020-11-01: 12.5 g via INTRAVENOUS

## 2020-11-01 MED ORDER — FENTANYL BOLUS VIA INFUSION
100.0000 ug | INTRAVENOUS | Status: DC | PRN
  Administered 2020-11-01: 100 ug via INTRAVENOUS
  Filled 2020-11-01: qty 100

## 2020-11-01 MED ORDER — DEXTROSE 10 % IV SOLN: INTRAVENOUS | Status: DC

## 2020-11-01 MED ORDER — HYDROMORPHONE HCL 1 MG/ML IJ SOLN: 2.0000 mg | INTRAMUSCULAR | Status: DC | PRN

## 2020-11-01 MED ORDER — GLYCOPYRROLATE 1 MG PO TABS
1.0000 mg | ORAL_TABLET | ORAL | Status: DC | PRN
  Filled 2020-11-01: qty 1

## 2020-11-01 MED ORDER — LEVETIRACETAM IN NACL 500 MG/100ML IV SOLN
500.0000 mg | Freq: Two times a day (BID) | INTRAVENOUS | Status: DC
  Administered 2020-11-01: 500 mg via INTRAVENOUS
  Filled 2020-11-01: qty 100

## 2020-11-01 MED ORDER — DEXMEDETOMIDINE HCL IN NACL 400 MCG/100ML IV SOLN
0.3000 ug/kg/h | INTRAVENOUS | Status: DC
  Administered 2020-11-01: 0.2 ug/kg/h via INTRAVENOUS
  Administered 2020-11-01: 0.3 ug/kg/h via INTRAVENOUS
  Filled 2020-11-01 (×2): qty 100

## 2020-11-16 NOTE — Death Summary Note (Signed)
DEATH SUMMARY   Patient Details  Name: Renne Platts MRN: [id number] DOB: 12/02/1975  Admission/Discharge Information   Admit Date:  11-12-2020  Date of Death: Date of Death: 11/17/2020  Time of Death: Time of Death: 1841  Length of Stay: 5  Referring Physician: Patient, No Pcp Per   Reason(s) for Hospitalization  OOH Cardiac Arrest Worsening multiorgan failure after cardiac arrest  Fulminant liver failure  Recently diagnosed ETOH cirrhosis  Hepatic vs. Hypoxemic encephalopathy  Acute hypoxemic respiratory due to ARDS from aspiration event  Acute renal failure  Hepatic coagulopathy   Diagnoses  Preliminary cause of death:  Secondary Diagnoses (including complications and co-morbidities):  Active Problems:   Cardiac arrest [facility])   Respiratory arrest (HCC)   Encounter for central line placement   Acute respiratory failure with hypoxia [facility])   Brief Hospital Course (including significant findings, care, treatment, and services provided and events leading to death)  45 year old male with history of alcohol abuse, alcoholic liver cirrhosis and prior history of GI bleeding who presented status post cardiac arrest this evening. Most of the history is taken from medical record as patient is intubated, unresponsive and sedated.  As per patient's wife he was in his usual state of health until about 2 days ago when he started feeling generalized weakness, he was seen at GI clinic for elevated LFTs, he was not feeling well whole day today, later on wife checked on him he was found unresponsive, they started CPR and EMS was summoned, ROSC was achieved after 12 minutes of CPR.  King's airway was placed and patient was brought into the emergency department.  In the emergency department King's airway was changed to ET tube, patient was noted to have bleeding from ETT and OGT.  Despite broad spectrum antibiotics, ARDS protocol ventilation, blood products, and vasopressors, patient continued to  deteriorate and went into fulminant liver failure.  Family decided to allow him to pass in peace on 17-Nov-2020.  Pertinent Labs and Studies  Significant Diagnostic Studies DG Abd 1 View  Result Date: 10/31/2020 CLINICAL DATA:  Decreased bowel sounds EXAM: ABDOMEN - 1 VIEW COMPARISON:  None. FINDINGS: Nasogastric tube tip and side port in stomach. There is no bowel dilatation or air-fluid level to suggest bowel obstruction. No free air on supine examination. No abnormal calcifications. IMPRESSION: No findings indicative of bowel obstruction or free air. Nasogastric tube tip and side port in stomach. Electronically Signed   By: Bretta Bang III M.D.   On: 10/31/2020 08:27   CT HEAD WO CONTRAST  Addendum Date: 10/31/2020   ADDENDUM REPORT: 10/31/2020 18:59 ADDENDUM: Original report by Dr. Margarita Grizzle. Addendum by Dr. Mosetta Putt on 10/31/2020 at 1857 hours: The study was reviewed by telephone with Dr. Amada Jupiter. There is new hypoattenuation throughout the basal ganglia and thalami bilaterally concerning for hypoxic ischemic injury. Electronically Signed   By: Sebastian Ache M.D.   On: 10/31/2020 18:59   Result Date: 10/31/2020 CLINICAL DATA:  Altered mental status following cardiac arrest EXAM: CT HEAD WITHOUT CONTRAST TECHNIQUE: Contiguous axial images were obtained from the base of the skull through the vertex without intravenous contrast. COMPARISON:  11/12/2020 FINDINGS: Brain: Ventricles and sulci are normal in size and configuration. There is no appreciable intracranial mass, hemorrhage, extra-axial fluid collection, or midline shift. There is preservation gray-white differentiation. No edema evident. No findings indicative of acute infarct. Vascular: No hyperdense vessel.  No evident vascular calcification. Skull: The bony calvarium appears intact. Sinuses/Orbits: There is mucosal thickening in  the left sphenoid sinus. 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 Other visualized paranasal sinuses are clear.  Visualized orbits appear symmetric bilaterally. Other: There is diffuse opacification of most mastoid air cells bilaterally. IMPRESSION: 1. No lesion involving brain parenchyma evident. There is preservation of gray-white differentiation without evident edema or focal infarct. No mass or hemorrhage. 2.  Opacification of most mastoid air cells bilaterally. 3.  Left sphenoid paranasal sinus disease. Electronically Signed: By: Bretta Bang III M.D. On: 10/31/2020 09:54   CT Head Wo Contrast  Result Date: 11/09/2020 CLINICAL DATA:  Delirium, lethargy, found unresponsive EXAM: CT HEAD WITHOUT CONTRAST TECHNIQUE: Contiguous axial images were obtained from the base of the skull through the vertex without intravenous contrast. COMPARISON:  None. FINDINGS: Brain: No acute infarct or hemorrhage. Lateral ventricles and midline structures are unremarkable. No acute extra-axial fluid collections. No mass effect. Vascular: No hyperdense vessel or unexpected calcification. Skull: Normal. Negative for fracture or focal lesion. Sinuses/Orbits: Mucosal thickening throughout the ethmoid air cells. No gas fluid levels. Other: None. IMPRESSION: 1. Ethmoid sinus disease. 2. Otherwise no acute intracranial process. Electronically Signed   By: Sharlet Salina M.D.   On: 10/26/2020 19:23   DG CHEST PORT 1 VIEW  Result Date: 10/31/2020 CLINICAL DATA:  Central line placement EXAM: PORTABLE CHEST 1 VIEW COMPARISON:  1 day prior FINDINGS: Right internal jugular line tip at high SVC. Endotracheal tube 5.3 cm above carina. Nasogastric tube extends beyond the inferior aspect of the film. Left internal jugular line tip unchanged at superior caval/atrial junction. Normal heart size. No pleural effusion or pneumothorax. Diffuse interstitial opacities are slightly increased. More confluent airspace disease in the left lung base is progressive. Improvement in right base airspace disease. IMPRESSION: Right internal jugular line tip at high  SVC, without pneumothorax. Progressive diffuse interstitial and shifting airspace opacities, pulmonary edema versus atypical infection. Electronically Signed   By: Jeronimo Greaves M.D.   On: 10/31/2020 13:01   DG Chest Port 1 View  Result Date: 10/30/2020 CLINICAL DATA:  Cardiac arrest.  Acute respiratory failure. EXAM: PORTABLE CHEST 1 VIEW COMPARISON:  Chest x-ray 10/16/2020 FINDINGS: Endotracheal tube in stable position. Left internal jugular central venous catheter in stable position. Enteric tube is again noted to course below the hemidiaphragm with tip and side port collimated off view. The heart size and mediastinal contours are within normal limits. Low lung volumes with persistent patchy airspace opacities no pleural effusion. No pneumothorax. No acute osseous abnormality. IMPRESSION: 1. Low lung volumes with persistent patchy airspace opacities. 2. Lines and tubes in stable position. Electronically Signed   By: Tish Frederickson M.D.   On: 10/30/2020 06:04   DG CHEST PORT 1 VIEW  Result Date: 10/26/2020 CLINICAL DATA:  Central line placement, abnormal chest x-ray EXAM: PORTABLE CHEST 1 VIEW COMPARISON:  10/24/2020 FINDINGS: Single frontal view of the chest demonstrates stable appearance of the endotracheal tube and enteric catheter. Left internal jugular catheter has been placed, tip overlying superior vena cava. The cardiac silhouette is stable. Widespread bilateral ground-glass airspace disease unchanged. No effusion or pneumothorax. IMPRESSION: 1. No complication after left internal jugular catheter placement. 2. Persistent widespread bilateral ground-glass airspace disease. Electronically Signed   By: Sharlet Salina M.D.   On: 10/17/2020 22:42   DG Chest Portable 1 View  Result Date: 10/21/2020 CLINICAL DATA:  Intubated, liver disease EXAM: PORTABLE CHEST 1 VIEW COMPARISON:  None. FINDINGS: Two supine frontal views of the chest are obtained. Endotracheal tube overlies tracheal air column, tip  approximately 2.5 cm above carina. Enteric catheter coiled over the gastric antrum. External defibrillator pads overlie the cardiac apex. Cardiac silhouette is unremarkable. Widespread bilateral ground-glass airspace disease could reflect widespread infection, edema, or ARDS. No effusion or pneumothorax. No acute bony abnormalities. IMPRESSION: 1. Support devices as above. 2. Widespread bilateral ground-glass airspace disease, which may reflect infection, edema, or ARDS. Electronically Signed   By: Sharlet SalinaMichael  Brown M.D.   On: 09/03/2020 18:27   EEG adult  Result Date: 10/28/2020 Charlsie QuestYadav, Priyanka O, MD     10/28/2020  3:28 PM Patient Name: Jamey ReasDrew Halling MRN: [id number] Epilepsy Attending: Charlsie QuestPriyanka O Yadav Referring Physician/Provider: Dr Cheri FowlerSudham Chand Date: 10/28/2020 Duration: 37.58 mins Patient history: 44yo m s/p cardiac arrrest. EEG to evaluate for seizure Level of alertness:  comatose AEDs during EEG study: None Technical aspects: This EEG study was done with scalp electrodes positioned according to the 10-20 International system of electrode placement. Electrical activity was acquired at a sampling rate of 500Hz  and reviewed with a high frequency filter of 70Hz  and a low frequency filter of 1Hz . EEG data were recorded continuously and digitally stored. Description: EEG showed bursts of epileptiform discharges lasting 0.5-1 second alternating with 5-10seconds of generalized eeg suppression.  Hyperventilation and photic stimulation were not performed.   ABNORMALITY - Burst suppression with epileptiform discharges, generalized IMPRESSION: This study is showed evidence of generalized epileptogenicity as well as profound diffuse encephalopathy. No seizures were seen throughout the recording. Priyanka Annabelle Harman Yadav   Overnight EEG with video  Result Date: 10/29/2020 Charlsie QuestYadav, Priyanka O, MD     10/30/2020 10:05 AM Patient Name: Jamey ReasDrew Hankerson MRN: [id number] Epilepsy Attending: Charlsie QuestPriyanka O Yadav Referring Physician/Provider: Dr  Levon Hedgeraniel Smith Duration: 10/28/2020 1010 to 10/29/2020 1010  Patient history: 44yo m s/p cardiac arrrest. EEG to evaluate for seizure  Level of alertness:  comatose  AEDs during EEG study: LEV, Versed, Propofol  Technical aspects: This EEG study was done with scalp electrodes positioned according to the 10-20 International system of electrode placement. Electrical activity was acquired at a sampling rate of 500Hz  and reviewed with a high frequency filter of 70Hz  and a low frequency filter of 1Hz . EEG data were recorded continuously and digitally stored.  Description: EEG initially showed bursts of epileptiform discharges lasting 0.5-1 second alternating with 5-10seconds of generalized eeg suppression. As AEDs were adjusted, eeg showed generalized suppression with generalized spikes every 3-7 seconds. Hyperventilation and photic stimulation were not performed.    ABNORMALITY - Spikes, generalized - Background suppression, generalized  IMPRESSION: This study is showed evidence of generalized epileptogenicity as well as profound diffuse encephalopathy. No seizures were seen throughout the recording.  Charlsie Questriyanka O Yadav   ECHOCARDIOGRAM COMPLETE  Result Date: 10/28/2020    ECHOCARDIOGRAM REPORT   Patient Name:   Jamey ReasDREW Roanhorse Date of Exam: 10/28/2020 Medical Rec #:  [id number]   Height:       64.0 in Accession #:    [id number]  Weight:       185.0 lb Date of Birth:  11/30/1975    BSA:          1.892 m Patient Age:    44 years    BP:           148/38 mmHg Patient Gender: M           HR:           117 bpm. Exam Location:  Inpatient Procedure: 2D Echo, Cardiac Doppler and Color Doppler Indications:    Cardiac  arrest  History:        Patient has no prior history of Echocardiogram examinations.                 Arrythmias:Cardiac Arrest. Alcohol abuse, septic shock,                 cirrhosis.  Sonographer:    Lavenia Atlas Referring Phys: [id number] Salome Holmes IMPRESSIONS  1. Left ventricular ejection fraction, by  estimation, is 65 to 70%. The left ventricle has normal function. The left ventricle has no regional wall motion abnormalities. Left ventricular diastolic parameters were normal.  2. Right ventricular systolic function is normal. The right ventricular size is normal. There is normal pulmonary artery systolic pressure.  3. The mitral valve is normal in structure. Trivial mitral valve regurgitation. No evidence of mitral stenosis.  4. Aortic valve gradient mildly elevated. Likely due to hyperdynamic left ventricular function. There is no evidence of valve obstruction. Cannot rule out subaortic membrane, though none is visible. The aortic valve is tricuspid. Aortic valve regurgitation is not visualized. No aortic stenosis is present. Aortic valve area, by VTI measures 3.29 cm. Aortic valve mean gradient measures 11.0 mmHg. Aortic valve Vmax measures 2.20 m/s.  5. The inferior vena cava is normal in size with greater than 50% respiratory variability, suggesting right atrial pressure of 3 mmHg. FINDINGS  Left Ventricle: Left ventricular ejection fraction, by estimation, is 65 to 70%. The left ventricle has normal function. The left ventricle has no regional wall motion abnormalities. The left ventricular internal cavity size was normal in size. There is  no left ventricular hypertrophy. Left ventricular diastolic parameters were normal. Right Ventricle: The right ventricular size is normal. No increase in right ventricular wall thickness. Right ventricular systolic function is normal. There is normal pulmonary artery systolic pressure. The tricuspid regurgitant velocity is 2.28 m/s, and  with an assumed right atrial pressure of 3 mmHg, the estimated right ventricular systolic pressure is 23.8 mmHg. Left Atrium: Left atrial size was normal in size. Right Atrium: Right atrial size was normal in size. Pericardium: There is no evidence of pericardial effusion. Mitral Valve: The mitral valve is normal in structure. Trivial  mitral valve regurgitation. No evidence of mitral valve stenosis. Tricuspid Valve: The tricuspid valve is normal in structure. Tricuspid valve regurgitation is trivial. No evidence of tricuspid stenosis. Aortic Valve: Aortic valve gradient mildly elevated. Likely due to hyperdynamic left ventricular function. There is no evidence of valve obstruction. Cannot rule out subaortic membrane, though none is visible. The aortic valve is tricuspid. Aortic valve regurgitation is not visualized. No aortic stenosis is present. Aortic valve mean gradient measures 11.0 mmHg. Aortic valve peak gradient measures 19.3 mmHg. Aortic valve area, by VTI measures 3.29 cm. Pulmonic Valve: The pulmonic valve was normal in structure. Pulmonic valve regurgitation is not visualized. No evidence of pulmonic stenosis. Aorta: The aortic root is normal in size and structure. Venous: The inferior vena cava is normal in size with greater than 50% respiratory variability, suggesting right atrial pressure of 3 mmHg. IAS/Shunts: No atrial level shunt detected by color flow Doppler.  LEFT VENTRICLE PLAX 2D LVIDd:         4.70 cm  Diastology LVIDs:         2.90 cm  LV e' medial:    14.40 cm/s LV PW:         1.10 cm  LV E/e' medial:  9.0 LV IVS:  1.00 cm  LV e' lateral:   18.80 cm/s LVOT diam:     2.20 cm  LV E/e' lateral: 6.9 LV SV:         113 LV SV Index:   59 LVOT Area:     3.80 cm  RIGHT VENTRICLE RV Basal diam:  2.90 cm RV S prime:     18.00 cm/s TAPSE (M-mode): 3.3 cm LEFT ATRIUM           Index       RIGHT ATRIUM           Index LA diam:      3.10 cm 1.64 cm/m  RA Area:     13.60 cm LA Vol (A2C): 34.0 ml 17.97 ml/m RA Volume:   31.50 ml  16.64 ml/m LA Vol (A4C): 49.0 ml 25.89 ml/m  AORTIC VALVE AV Area (Vmax):    3.57 cm AV Area (Vmean):   3.07 cm AV Area (VTI):     3.29 cm AV Vmax:           219.50 cm/s AV Vmean:          157.000 cm/s AV VTI:            0.342 m AV Peak Grad:      19.3 mmHg AV Mean Grad:      11.0 mmHg LVOT Vmax:          206.00 cm/s LVOT Vmean:        127.000 cm/s LVOT VTI:          0.296 m LVOT/AV VTI ratio: 0.87  AORTA Ao Root diam: 2.40 cm MITRAL VALVE                TRICUSPID VALVE MV Area (PHT): 10.99 cm    TR Peak grad:   20.8 mmHg MV Decel Time: 69 msec      TR Vmax:        228.00 cm/s MV E velocity: 129.00 cm/s MV A velocity: 111.00 cm/s  SHUNTS MV E/A ratio:  1.16         Systemic VTI:  0.30 m                             Systemic Diam: 2.20 cm Chilton Si MD Electronically signed by Chilton Si MD Signature Date/Time: 10/28/2020/12:58:15 PM    Final     Microbiology Recent Results (from the past 240 hour(s))  Resp Panel by RT-PCR (Flu A&B, Covid) Nasopharyngeal Swab     Status: None   Collection Time: 10/18/2020  5:59 PM   Specimen: Nasopharyngeal Swab; Nasopharyngeal(NP) swabs in vial transport medium  Result Value Ref Range Status   SARS Coronavirus 2 by RT PCR NEGATIVE NEGATIVE Final    Comment: (NOTE) SARS-CoV-2 target nucleic acids are NOT DETECTED.  The SARS-CoV-2 RNA is generally detectable in upper respiratory specimens during the acute phase of infection. The lowest concentration of SARS-CoV-2 viral copies this assay can detect is 138 copies/mL. A negative result does not preclude SARS-Cov-2 infection and should not be used as the sole basis for treatment or other patient management decisions. A negative result may occur with  improper specimen collection/handling, submission of specimen other than nasopharyngeal swab, presence of viral mutation(s) within the areas targeted by this assay, and inadequate number of viral copies(<138 copies/mL). A negative result must be combined with clinical observations, patient history, and epidemiological information. The expected result is  Negative.  Fact Sheet for Patients:  BloggerCourse.com  Fact Sheet for Healthcare Providers:  SeriousBroker.it  This test is no t yet approved or  cleared by the [place] FDA and  has been authorized for detection and/or diagnosis of SARS-CoV-2 by FDA under an Emergency Use Authorization (EUA). This EUA will remain  in effect (meaning this test can be used) for the duration of the COVID-19 declaration under Section 564(b)(1) of the Act, 21 U.S.C.section 360bbb-3(b)(1), unless the authorization is terminated  or revoked sooner.       Influenza A by PCR NEGATIVE NEGATIVE Final   Influenza B by PCR NEGATIVE NEGATIVE Final    Comment: (NOTE) The Xpert Xpress SARS-CoV-2/FLU/RSV plus assay is intended as an aid in the diagnosis of influenza from Nasopharyngeal swab specimens and should not be used as a sole basis for treatment. Nasal washings and aspirates are unacceptable for Xpert Xpress SARS-CoV-2/FLU/RSV testing.  Fact Sheet for Patients: BloggerCourse.com  Fact Sheet for Healthcare Providers: SeriousBroker.it  This test is not yet approved or cleared by the [place] FDA and has been authorized for detection and/or diagnosis of SARS-CoV-2 by FDA under an Emergency Use Authorization (EUA). This EUA will remain in effect (meaning this test can be used) for the duration of the COVID-19 declaration under Section 564(b)(1) of the Act, 21 U.S.C. section 360bbb-3(b)(1), unless the authorization is terminated or revoked.  Performed at [facility], [address], [place], [place] [address]   Blood culture (routine x 2)     Status: None   Collection Time: 2020/11/25  6:15 PM   Specimen: BLOOD  Result Value Ref Range Status   Specimen Description BLOOD BACTERIAL CASTS  Final   Special Requests   Final    BOTTLES DRAWN AEROBIC AND ANAEROBIC Blood Culture adequate volume   Culture   Final    NO GROWTH 5 DAYS Performed at [facility], [address], [place], [place] [address]    Report Status 10/18/2020 FINAL  Final  MRSA PCR Screening     Status: None    Collection Time: 11-25-2020  7:48 PM   Specimen: Nasal Mucosa; Nasopharyngeal  Result Value Ref Range Status   MRSA by PCR NEGATIVE NEGATIVE Final    Comment:        The GeneXpert MRSA Assay (FDA approved for NASAL specimens only), is one component of a comprehensive MRSA colonization surveillance program. It is not intended to diagnose MRSA infection nor to guide or monitor treatment for MRSA infections. Performed at [facility], [address], [place], [place] [address]   Blood culture (routine x 2)     Status: None   Collection Time: 11/25/2020  8:51 PM   Specimen: BLOOD RIGHT HAND  Result Value Ref Range Status   Specimen Description BLOOD RIGHT HAND  Final   Special Requests   Final    BOTTLES DRAWN AEROBIC ONLY Blood Culture results may not be optimal due to an inadequate volume of blood received in culture bottles   Culture   Final    NO GROWTH 5 DAYS Performed at [facility], [address], [place], [place] [address]    Report Status 10/24/2020 FINAL  Final    Lab Basic Metabolic Panel: Recent Labs  Lab 10/30/20 0337 10/31/20 0317 10/31/20 1636 10/29/2020 0334  NA 132* 134* 136 135  K 4.3 4.2 4.9 5.0  CL 95* 97* 100 99  CO2 22 21* 17* 16*  GLUCOSE 223* 118* 76 66*  BUN 34* 49* 49* 37*  CREATININE 3.16* 4.27* 4.06* 3.33*  CALCIUM 7.5* 7.7* 7.7* 8.1*  MG 2.3 2.4  --  2.5*  PHOS 5.1* 6.3* 7.1* 6.8*   Liver Function Tests: Recent Labs  Lab 10/30/20 0337 10/31/20 0317 10/31/20 1636 11/10/2020 0334  AST 1,513* 982*  --  817*  ALT 589* 539*  --  609*  ALKPHOS 61 63  --  78  BILITOT 13.7* 15.2*  --  20.4*  PROT 6.2* 6.1*  --  7.1  ALBUMIN 2.0* 2.0* 2.0* 2.2*   No results for input(s): LIPASE, AMYLASE in the last 168 hours. Recent Labs  Lab 10/30/20 0338 10/31/20 0317 11/05/2020 0334  AMMONIA 170* 198* 204*   CBC: Recent Labs  Lab 10/30/20 0337 10/31/20 0317 10/24/2020 0334  WBC 24.7* 22.9* 19.7*  HGB 8.8* 8.7* 9.8*  HCT 26.2* 24.8*  28.5*  MCV 115.4* 112.2* 113.5*  PLT 46* 38* 39*   Cardiac Enzymes: No results for input(s): CKTOTAL, CKMB, CKMBINDEX, TROPONINI in the last 168 hours. Sepsis Labs: Recent Labs  Lab 10/30/20 0337 10/31/20 0317 10/20/2020 0334  WBC 24.7* 22.9* 19.7*     Lorin Glass 11/05/2020, 1:27 PM

## 2020-11-16 NOTE — Progress Notes (Signed)
Patient's family made aware this morning by PCCM MD Tamala Julian of grim prognosis and that patient is at end of life. Patient's wife Logan Shaw and brother Logan Shaw decided to pursue comfort care/withdrawal of care once family and friends had a chance to visit with patient. Palliative care NP Kasie met with patient's family this afternoon and the decision was made to change code status to DNR, stop CRRT, and pursue withdrawal of care once family was ready. Family made me aware that they were ready for withdrawal of care around 1815 and patient was compassionately extubated at [place]. Following extubation, vasopressors were turned off per comfort care orders. Patient maintained on fentanyl gtt and other ordered comfort medications as documented in [facility]. Patient went asystole at [place], with his wife, brother, and mother at the bedside. Time of death pronounced by this RN and Howie Ill. MD Tamala Julian notified of time of death and [facility] made aware of TOD by [facility]-link. Post-mortem documentation is mostly complete but night shift RN Logan Shaw complete body prep and transport patient to morgue.

## 2020-11-16 NOTE — Progress Notes (Signed)
Hypoglycemic Event  CBG: 59  Treatment: D50 25 mL (12.5 gm)  Symptoms: None  Follow-up CBG: Time: 0401 CBG Result:106  Possible Reasons for Event: Unknown  Comments/MD notified: n/a    Logan Shaw

## 2020-11-16 NOTE — Progress Notes (Signed)
Critical INR 7.1 and Total Bilirubin 20.4 reported to [facility]. No overt bleeding noted. Awaiting return call for orders.

## 2020-11-16 NOTE — Progress Notes (Signed)
Patient was compassionately extubated at 1827 per MD order/family request for comfort care.

## 2020-11-16 NOTE — Progress Notes (Signed)
eLink Physician-Brief Progress Note Patient Name: Logan Shaw DOB: 1976-06-16 MRN: [id number]   Date of Service  10/26/2020  HPI/Events of Note  INR 7.1, no evidence of active hemorrhage, patient has received adequate amounts of Vitamin K to correct any nutritional component, current INR reflects hepatocyte synthetic failure due to advanced liver disease.  eICU Interventions  No intervention at this time, monitor closely for development of active bleeding and treat at that time.        Leam Madero U Linh Hedberg 11/09/2020, 6:00 AM

## 2020-11-16 NOTE — Progress Notes (Signed)
Subjective: No significant changes, ammonia still quite high.   CT read initially as normal, but I see indistinct deep gray structures bilaterally, cortical gray-white is mostly preserved, but the deep gray appear edematous to me.   Exam: Vitals:   11/12/2020 0815 2020/11/12 0830  BP:    Pulse: 99 100  Resp: 16 19  Temp: (!) 97.34 F (36.3 C) (!) 97.34 F (36.3 C)  SpO2: 100% 100%   Gen: In bed, intubated Resp: Ventilated Abd: soft, nt  Neuro: MS: Does not open eyes or follow commands CN: Pupils equal round and reactive, corneals were absent, no reaction to doll's eye maneuver.   Motor: No movement to noxious stimulation Sensory: As above   Pertinent Labs: Ammonia 298->170->198->(dialysis started)->204  Impression: 45 year old male who presents with presumed aspiration due to DTs.  Now with ARDS and severe shock.  His EEG is quite concerning in the post anoxic arrest setting.  His severe hyperammonemia is a confounding factor, and I would favor continuing to treat this. Imaging shows evidence of deep gray edema, though this has been described in hyperammoonemic encephalopathy(though on MRI) as well as hypoxic/ischemic injury, it is usually accompanied by cortical changes in hyperammonemia. I suspect significant hypoxic brain injury at this point.  Unfortunately, neurological prognosis does not seem to be his only concern at this point as his ammonia is increasing despite dialysis and his livers synthetic function appears to be significantly impaired as well.  Critical care has discussed his state with the family and they are gathering this afternoon with the plan to likley go to comfort care.    Recommendations: 1) decrease keppra to 500mg  BID.  2) if family changes wishes and desires continued aggressive, please let neurology know and we can continue to follow. We are available as needed for questions.   , MD [facility] [phone]  If 7pm-  7am, please page neurology on call as listed in AMION. 12-Nov-2020  8:47 AM

## 2020-11-16 NOTE — Progress Notes (Signed)
Patient ID: Audelia Acton, male   DOB: Nov 20, 1975, 45 y.o.   MRN: [id number] [facility] Progress Note   Assessment/ Plan:   1.  Acute kidney injury: With ATN that is multifactorial.    Started on CRRT yesterday for management of azotemia/volume and clearance of ammonia with net negative ultrafiltration of 200 cc/h that he has tolerated overnight with increasing pressor requirements limiting UF goal.  Continue current CRRT prescription. 2.  Status post cardiac arrest: With ventilator dependent respiratory failure and evidence of hypoxic encephalopathy.    Ongoing CRRT with ultrafiltration for volume unloading. 3.  Alcoholic liver cirrhosis: Complicated by recent upper GI bleed and significantly elevated ammonia levels but continued to rise with medical management.  Ammonia levels remain elevated on CRRT. 4.  Anemia: Likely secondary to chronic illness and complicated by recent GI bleed, monitor trend.  Subjective:   Without acute events overnight; increased pressor requirements with efforts at higher ultrafiltration.   Objective:   BP (!) 104/55 (BP Location: Other (Comment)) Comment (BP Location): aline  Pulse (!) 103   Temp 97.7 F (36.5 C)   Resp 18   Ht 5' 4"  (1.626 m)   Wt 88.1 kg   SpO2 100%   BMI 33.34 kg/m   Intake/Output Summary (Last 24 hours) at 2020-11-24 0714 Last data filed at 24-Nov-2020 0700 Gross per 24 hour  Intake 2958.42 ml  Output 6439 ml  Net -3480.58 ml   Weight change: -1.1 kg  Physical Exam: Gen: Intubated, sedated, appears comfortable CVS: Pulse regular tachycardia, ejection systolic murmur over precordium Resp: Anteriorly clear to auscultation, no rales/rhonchi.  Petechiae over upper chest Abd: Soft, moderately distended, bowel sounds normal Ext: 2+ anasarca  Imaging: DG Abd 1 View  Result Date: 10/31/2020 CLINICAL DATA:  Decreased bowel sounds EXAM: ABDOMEN - 1 VIEW COMPARISON:  None. FINDINGS: Nasogastric tube tip and side port in  stomach. There is no bowel dilatation or air-fluid level to suggest bowel obstruction. No free air on supine examination. No abnormal calcifications. IMPRESSION: No findings indicative of bowel obstruction or free air. Nasogastric tube tip and side port in stomach. Electronically Signed   By: Lowella Grip III M.D.   On: 10/31/2020 08:27   CT HEAD WO CONTRAST  Addendum Date: 10/31/2020   ADDENDUM REPORT: 10/31/2020 18:59 ADDENDUM: Original report by Dr. Jasmine December. Addendum by Dr. Jeralyn Ruths on 10/31/2020 at [place] hours: The study was reviewed by telephone with Dr. Leonel Ramsay. There is new hypoattenuation throughout the basal ganglia and thalami bilaterally concerning for hypoxic ischemic injury. Electronically Signed   By: Logan Bores M.D.   On: 10/31/2020 18:59   Result Date: 10/31/2020 CLINICAL DATA:  Altered mental status following cardiac arrest EXAM: CT HEAD WITHOUT CONTRAST TECHNIQUE: Contiguous axial images were obtained from the base of the skull through the vertex without intravenous contrast. COMPARISON:  October 27, 2020 FINDINGS: Brain: Ventricles and sulci are normal in size and configuration. There is no appreciable intracranial mass, hemorrhage, extra-axial fluid collection, or midline shift. There is preservation gray-white differentiation. No edema evident. No findings indicative of acute infarct. Vascular: No hyperdense vessel.  No evident vascular calcification. Skull: The bony calvarium appears intact. Sinuses/Orbits: There is mucosal thickening in the left sphenoid sinus. 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 Other visualized paranasal sinuses are clear. Visualized orbits appear symmetric bilaterally. Other: There is diffuse opacification of most mastoid air cells bilaterally. IMPRESSION: 1. No lesion involving brain parenchyma evident. There is preservation of gray-white differentiation without evident edema or  focal infarct. No mass or hemorrhage. 2.  Opacification of most mastoid air cells  bilaterally. 3.  Left sphenoid paranasal sinus disease. Electronically Signed: By: Lowella Grip III M.D. On: 10/31/2020 09:54   DG CHEST PORT 1 VIEW  Result Date: 10/31/2020 CLINICAL DATA:  Central line placement EXAM: PORTABLE CHEST 1 VIEW COMPARISON:  1 day prior FINDINGS: Right internal jugular line tip at high SVC. Endotracheal tube 5.3 cm above carina. Nasogastric tube extends beyond the inferior aspect of the film. Left internal jugular line tip unchanged at superior caval/atrial junction. Normal heart size. No pleural effusion or pneumothorax. Diffuse interstitial opacities are slightly increased. More confluent airspace disease in the left lung base is progressive. Improvement in right base airspace disease. IMPRESSION: Right internal jugular line tip at high SVC, without pneumothorax. Progressive diffuse interstitial and shifting airspace opacities, pulmonary edema versus atypical infection. Electronically Signed   By: Abigail Miyamoto M.D.   On: 10/31/2020 13:01    Labs: BMET Recent Labs  Lab 10/28/20 0512 10/28/20 0523 10/28/20 2034 10/28/20 2254 10/29/20 0442 10/29/20 0639 10/29/20 0750 10/30/20 0337 10/31/20 0317 10/31/20 1636 11-24-2020 0334  NA 132*   < > 132* 134*  --  135 133* 132* 134* 136 135  K 3.5   < > 4.4 4.6  --  4.4 4.4 4.3 4.2 4.9 5.0  CL 91*  --  93*  --   --   --  94* 95* 97* 100 99  CO2 17*  --  18*  --   --   --  18* 22 21* 17* 16*  GLUCOSE 92  --  74  --   --   --  120* 223* 118* 76 66*  BUN 20  --  21*  --   --   --  25* 34* 49* 49* 37*  CREATININE 1.64*  --  1.74*  --   --   --  2.32* 3.16* 4.27* 4.06* 3.33*  CALCIUM 8.2*  --  8.0*  --   --   --  7.7* 7.5* 7.7* 7.7* 8.1*  PHOS 4.5  --   --   --  6.8*  --   --  5.1* 6.3* 7.1* 6.8*   < > = values in this interval not displayed.   CBC Recent Labs  Lab 10/22/2020 2255 10/28/20 0330 10/28/20 2034 10/28/20 2254 10/29/20 0442 10/29/20 0639 10/29/20 0935 10/30/20 0337 10/31/20 0317 11/24/20 0334   WBC 6.4   < > 14.1*  --  17.6*  --   --  24.7* 22.9* 19.7*  NEUTROABS 3.9  --  11.4*  --   --   --   --   --   --   --   HGB 11.6*   < > 10.2*   < > 9.7* 10.5*  --  8.8* 8.7* 9.8*  HCT 32.8*   < > 30.5*   < > 29.4* 31.0*  --  26.2* 24.8* 28.5*  MCV 108.3*   < > 116.0*  --  117.6*  --   --  115.4* 112.2* 113.5*  PLT 48*   < > 49*  47*  --  48*  --  47* 46* 38* 39*   < > = values in this interval not displayed.    Medications:    . artificial tears  1 application Both Eyes H6D  . chlorhexidine gluconate (MEDLINE KIT)  15 mL Mouth Rinse BID  . Chlorhexidine Gluconate Cloth  6 each Topical Daily  .  feeding supplement (PROSource TF)  45 mL Per Tube TID  . folic acid  1 mg Intravenous Daily  . heparin sodium (porcine)  2.4 mL Intracatheter Once  . hydrocortisone sod succinate (SOLU-CORTEF) inj  50 mg Intravenous Q12H  . lactulose  30 g Per Tube TID  . mouth rinse  15 mL Mouth Rinse 10 times per day  . nicotine  21 mg Transdermal Daily  . pantoprazole  40 mg Intravenous Q12H  . rifaximin  550 mg Per Tube BID  . sodium chloride flush  10-40 mL Intracatheter Q12H   Elmarie Shiley, MD 2020-11-16, 7:14 AM

## 2020-11-16 NOTE — Progress Notes (Signed)
eLink Physician-Brief Progress Note Patient Name: Logan Shaw DOB: 21-Jul-1976 MRN: [id number]   Date of Service  2020-11-13  HPI/Events of Note  Patient with marked ventilator dyssynchrony, although he is maintaining his saturation for now.  eICU Interventions  Fentanyl infusion ceiling increased to 300 mcg and  Precedex infusion started at a fixed dose of 0.2 mcg. Will monitor blood pressure closely for adverse hemodynamic impact.        [name] 2020/11/13, 1:37 AM

## 2020-11-16 NOTE — Consult Note (Signed)
Consultation Note Date: 2020/11/28   Patient Name: Logan Shaw  DOB: Nov 19, 1975  MRN: [id number]  Age / Sex: 45 y.o., male  PCP: Patient, No Pcp Per Referring Physician: Candee Furbish, MD  Reason for Consultation: Establishing goals of care  HPI/Patient Profile: 45 y.o. male  with past medical history of recently diagnosed hepatic cirrhosis r/t ETOH use,  admitted on 11/14/2020 with cardiac arrest resulting in multiorgan failure and likely anoxic brain injury. He is intubated, on CRRT, multiple pressors. Prognosis is grim and this has been discussed with family by attending team. Palliative medicine consulted for assistance with transition to full comfort measures only.   Clinical Assessment and Goals of Care: Met with patient's spouse, brother and lifelong friend.  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  He is known to enjoy practical jokes.  Logan Shaw shares they are waiting on friends to arrive from out of town. She and patient's brother endorse the plan to extubate and transition patient to full comfort measures and support patient through dying process.  We discussed the process of transitioning to full comfort, expectations after extubation, and stopping IV pressors. In preparation for planned transition to full comfort- all are in agreement with stopping tube feeding and CRRT now.  Code status was discussed- if patient experiences cardiac arrest before extubation- then agree to no CPR and allowing for peacefully passing with dignity.  Emotional support provided.   Primary Decision Maker NEXT OF KIN- spouse- Logan Shaw    SUMMARY OF RECOMMENDATIONS -DNR -stop tube feeding and CRRT now -plan for extubation to comfort when family is ready -comfort medications as ordered    Code Status/Advance Care  Planning:  DNR  Prognosis:    Hours - Days  Discharge Planning: Anticipated Hospital Death  Primary Diagnoses: Present on Admission: . Cardiac arrest ([place])   I have reviewed the medical record, interviewed the patient and family, and examined the patient. The following aspects are pertinent.  Past Medical History:  Diagnosis Date  . Liver disease, chronic, with cirrhosis ([place])    Social History   Socioeconomic History  . Marital status: Unknown    Spouse name: Not on file  . Number of children: Not on file  . Years of education: Not on file  . Highest education level: Not on file  Occupational History  . Not on file  Tobacco Use  . Smoking status: Not on file  . Smokeless tobacco: Not on file  Substance and Sexual Activity  . Alcohol use: Not on file  . Drug use: Not on file  . Sexual activity: Not on file  Other Topics Concern  . Not on file  Social History Narrative  . Not on file   Social Determinants of Health   Financial Resource Strain: Not on file  Food Insecurity: Not on file  Transportation Needs: Not on file  Physical Activity: Not on file  Stress: Not on  file  Social Connections: Not on file   Scheduled Meds: . artificial tears  1 application Both Eyes V3K  . Chlorhexidine Gluconate Cloth  6 each Topical Daily  . LORazepam  2 mg Intravenous Q4H  . nicotine  21 mg Transdermal Daily  . pantoprazole  40 mg Intravenous Q12H   Continuous Infusions: . sodium chloride    . sodium chloride 10 mL/hr at 11-12-2020 1600  . sodium chloride    . angiotensin II (GIAPREZA) infusion Stopped (10/31/20 1203)  . dexmedetomidine (PRECEDEX) IV infusion 0.3 mcg/kg/hr (11-12-2020 1617)  . fentaNYL infusion INTRAVENOUS 300 mcg/hr (11-12-20 1600)  . levETIRAcetam Stopped (11-12-2020 1219)  . norepinephrine (LEVOPHED) Adult infusion 38 mcg/min (12-Nov-2020 1600)  . propofol (DIPRIVAN) infusion 40.552 mcg/kg/min (10/30/20 0100)  . vasopressin 0.04 Units/min (2020-11-12 1600)    PRN Meds:.Place/Maintain arterial line **AND** sodium chloride, acetaminophen (TYLENOL) oral liquid 160 mg/5 mL, fentaNYL, glycopyrrolate **OR** glycopyrrolate **OR** glycopyrrolate, HYDROmorphone (DILAUDID) injection, ondansetron (ZOFRAN) IV Medications Prior to Admission:  Prior to Admission medications   Medication Sig Start Date End Date Taking? Authorizing Provider  aspirin-acetaminophen-caffeine (EXCEDRIN MIGRAINE) [phone] MG tablet Take 2 tablets by mouth every 6 (six) hours as needed for headache.   Yes [provider]  dicyclomine (BENTYL) 10 MG capsule Take 10 mg by mouth 2 (two) times daily. 10/22/20  Yes [provider]  fluticasone (FLONASE) 50 MCG/ACT nasal spray Place 1 spray into both nostrils at bedtime as needed for allergies or rhinitis.   Yes [provider]  loperamide (IMODIUM) 2 MG capsule Take 2-4 mg by mouth See admin instructions. Take 2 capsules (4 mg) by mouth at 1st loose stool, diarrhea, then take 1 capsule (2 mg) after each subsequent loose stool, diarrhea   Yes [provider]  phenylephrine-shark liver oil-mineral oil-petrolatum (PREPARATION H) 0.25-14-74.9 % rectal ointment Place 1 application rectally 2 (two) times daily as needed for hemorrhoids.   Yes [provider]   No Known Allergies Review of Systems  Unable to perform ROS: Intubated    Physical Exam Vitals and nursing note reviewed.  Constitutional:      Appearance: He is ill-appearing and toxic-appearing.  Skin:    Coloration: Skin is jaundiced.     Vital Signs: BP (!) 104/55 (BP Location: Other (Comment)) Comment (BP Location): aline  Pulse 96   Temp (!) 96.26 F (35.7 C)   Resp 12   Ht 5' 4"  (1.626 m)   Wt 88.1 kg   SpO2 100%   BMI 33.34 kg/m  Pain Scale: CPOT POSS *See Group Information*: 4-INTERVENTION REQUIRED,Unacceptable,Somnolent, mininal or no response to verbal and physical stimulation     SpO2: SpO2: 100 % O2 Device:SpO2:  100 % O2 Flow Rate: .   IO: Intake/output summary:   Intake/Output Summary (Last 24 hours) at 12-Nov-2020 1651 Last data filed at 11/12/20 1600 Gross per 24 hour  Intake 5061 ml  Output 8269 ml  Net -3208 ml    LBM: Last BM Date:  (PTA) Baseline Weight: Weight: 82.2 kg Most recent weight: Weight: 88.1 kg     Palliative Assessment/Data: PPS: 10%     Thank you for this consult. Palliative medicine will continue to follow and assist as needed.   Time In: 1502 Time Out: 1413 Time Total: 73 minutes Greater than 50%  of this time was spent counseling and coordinating care related to the above assessment and plan.  Signed by: Mariana Kaufman, AGNP-C Palliative Medicine    Please contact Palliative Medicine  Team phone at [phone] for questions and concerns.  For individual provider: See Shea Evans

## 2020-11-16 NOTE — Progress Notes (Signed)
This is a progress note   NAME:  Logan Shaw, MRN:  [id number], DOB:  1976/04/28, LOS: 5 ADMISSION DATE:  10/23/2020, CONSULTATION DATE: 10/22/2020 REFERRING MD: Olivia Canter, CHIEF COMPLAINT: Status post cardiac arrest  Brief History:  45 year old male with with alcohol abuse, recently diagnosed alcholic liver disease question cirrhosis who presented status post cardiac arrest, witnessed by his wife and bystander, ROSC achieved after 12 minutes.  Also discovered to be in ARDS and septic shock.  History of Present Illness:  45 year old male with history of alcohol abuse, alcoholic liver cirrhosis and prior history of GI bleeding who presented status post cardiac arrest this evening. Most of the history is taken from medical record as patient is intubated, unresponsive and sedated.  As per patient's wife he was in his usual state of health until about 2 days ago when he started feeling generalized weakness, he was seen at GI clinic for elevated LFTs, he was not feeling well whole day today, later on wife checked on him he was found unresponsive, they started CPR and EMS was summoned, ROSC was achieved after 12 minutes of CPR.  King's airway was placed and patient was brought into the emergency department.  In the emergency department King's airway was changed to ET tube, patient was noted to have bleeding from ETT and OGT.  Past Medical History:   Past Medical History:  Diagnosis Date  . Liver disease, chronic, with cirrhosis ([facility])      Significant Hospital Events:  Admit 3/12 > CRRT 3/16 >  Consults:  PCCM  Procedures:  ETT 3/12 > Central Venous cath for CRRT 3/16 >  Significant Diagnostic Tests:  X-ray chest: Reviewed by myself showing bilateral airspace disease consistent with ARDS\ 3/12 CT head: Ethmoid sinus disease. Otherwise no acute intracranial process. 3/16 CT Head: There is new hypoattenuation throughout the basal ganglia and thalami bilaterally concerning for hypoxic  ischemic injury.  Micro Data:  3/12 Blood Cx: NGTD  Antimicrobials:  Vancomycin 3/12 >>3/13 Zosyn 3/12 >>  Interim History / Subjective:  Started CRRT yesterday  Objective   Blood pressure (!) 104/55, pulse (!) 103, temperature 97.7 F (36.5 C), resp. rate 19, height 5\' 4"  (1.626 m), weight 88.1 kg, SpO2 100 %.    Vent Mode: PRVC FiO2 (%):  [50 %-70 %] 70 % Set Rate:  [34 bmp] 34 bmp Vt Set:  [420 mL] 420 mL PEEP:  [12 cmH20] 12 cmH20 Plateau Pressure:  [12 cmH20-26 cmH20] 12 cmH20   Intake/Output Summary (Last 24 hours) at 05-Nov-2020 11/03/2020 Last data filed at 11-05-2020 0600 Gross per 24 hour  Intake 2815.58 ml  Output 6115 ml  Net -3299.42 ml   Filed Weights   10/30/20 0500 10/31/20 0323 11-05-2020 0459  Weight: 86.5 kg 89.2 kg 88.1 kg    Examination: Constitutional: Critically ill middle-age gentleman, continue to remain comatose Eyes: Bilateral scleral icterus, conjunctival injection, unable to track Ears, nose, mouth, and throat: ETT in place Cardiovascular: Tachycardic, no murmurs, gallops, rubs Respiratory: Breath sounds present, no crackles, rhonchi Gastrointestinal: Absent/decreased bowel sounds Skin: Right upper extremity bruise Neurologic: Remains comatose,no corneal reflex, no gag reflex, motor function unable to be determined at this point  Resolved Hospital Problem list     Assessment & Plan:  Assessment: OOH Cardiac arrest- suspicion here is EtOH withdrawal leading to seizure/aspiration event.  This aspiration led to ARDS, decompensated alcoholic cirrhosis, alcoholic hepatitis with Maddrey's Discriminant score of 191, coagulopathy, and distributive shock.    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 Flori recover, he has progressed to fulminant liver failure that is evident on his labs today showing an impaired synthetic liver function with persistent elevated D-dimer, INR, PT, PTT.  Though we started CRRT yesterday, his ammonia has continued to worsen.  This is also not improved with lactulose or rifaximin.  In addition, his CT scan of the head yesterday was concerning for hypoxic-ischemic injury at the basal ganglia and thalamus.  There was a conversation with the family this morning for which a decision is being made to transition to comfort care but are awaiting other family members  -With ARDS, continue LTVV, 4-8cc/kg IBW with goal Pplat <30 and DP<15.  -Discontinue Zosyn -Continue lactulose and rifaximin  -Continue Keppra -Continue Giapreza, vasopressin for hemodynamic support.  Last dose of stress dose steroids today -Continue CRRT for now  Best practice (evaluated daily)  Diet: NPO Pain/Anxiety/Delirium protocol (if indicated): Propofol/as needed fentanyl VAP protocol (if indicated): Yes DVT prophylaxis: SCDs GI prophylaxis: Protonix Glucose control: CBG monitoring Mobility: Bedrest Disposition: ICU  Goals of Care:  Family has been updated regarding his grim and futile prognosis per   Jodelle Red, MD PGY-3 Internal Medicine Teaching Service

## 2020-11-16 DEATH — deceased

## 2022-10-13 IMAGING — CT CT HEAD W/O CM
4 series · 15 of 47 positions shown, 17 images · non-contrast
Comparison: October 27, 2020
COMPARISON: October 27, 2020

Addendum:
CLINICAL DATA: Altered mental status following cardiac arrest

EXAM:
CT HEAD WITHOUT CONTRAST
TECHNIQUE: Contiguous axial images were obtained from the base of the skull
through the vertex without intravenous contrast.

[Series 3: head without · axial · non-contrast · 0.44mm/px · z∈[-92,+13]mm · 7 of 29 slices shown, 9 images]
[im 4/29  brain]
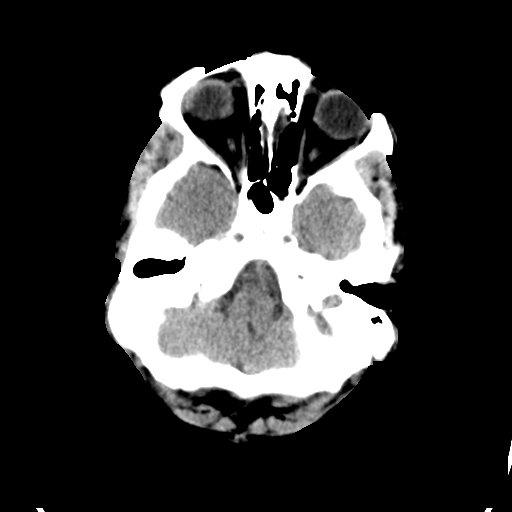
[im 4/29  bone]
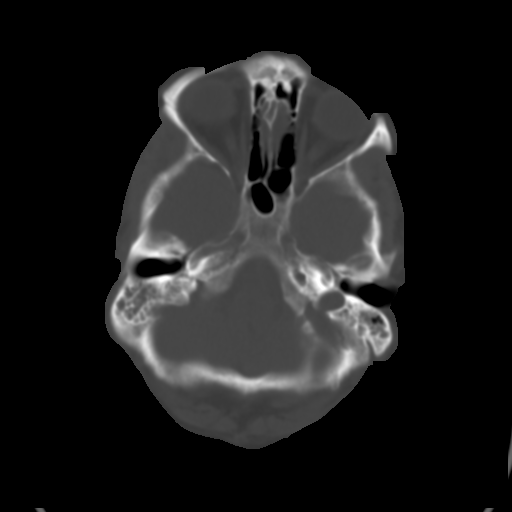
[im 8/29  brain]
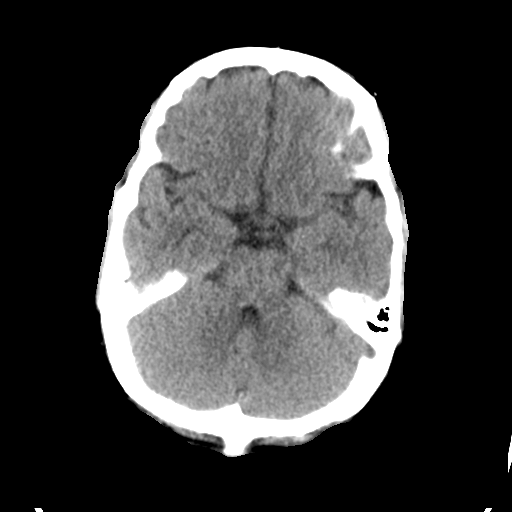
[im 11/29  brain]
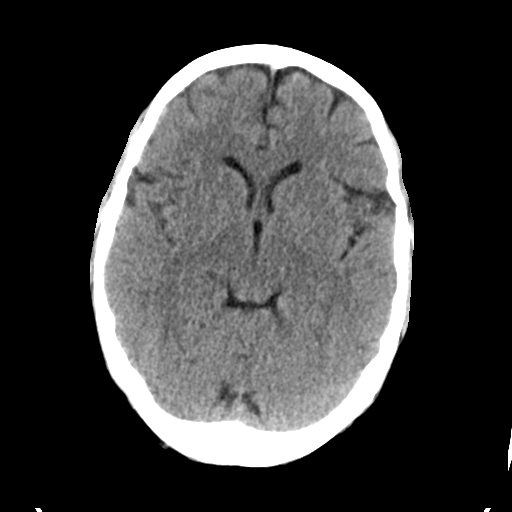
[im 15/29  brain]
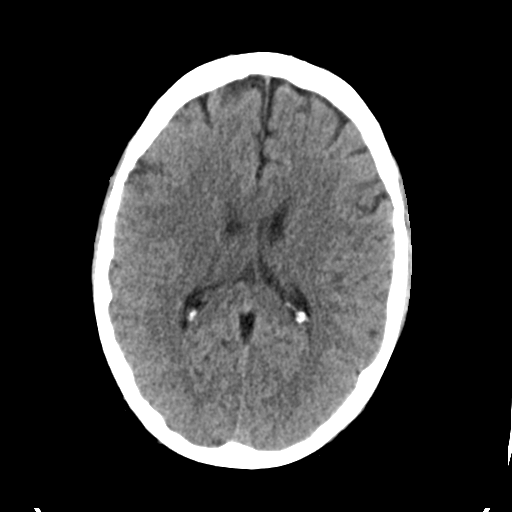
[im 18/29  brain]
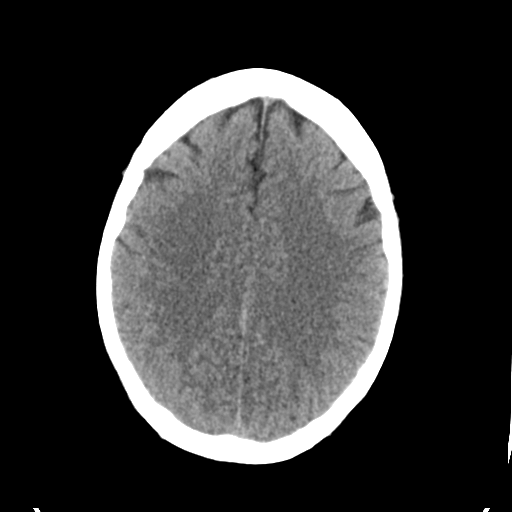
[im 18/29  bone]
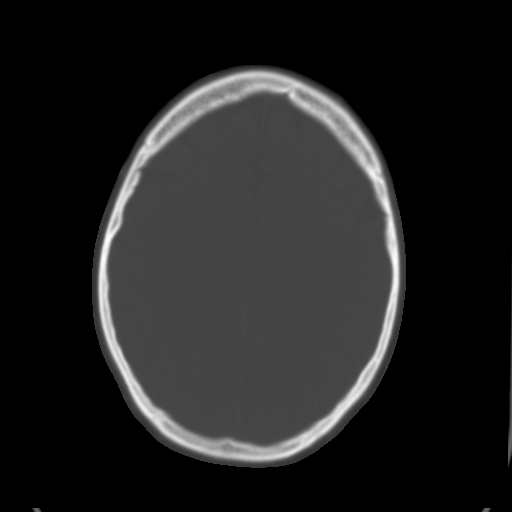
[im 22/29  brain]
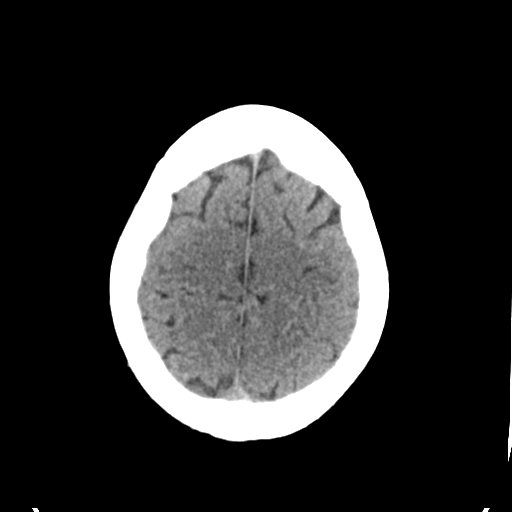
[im 25/29  brain]
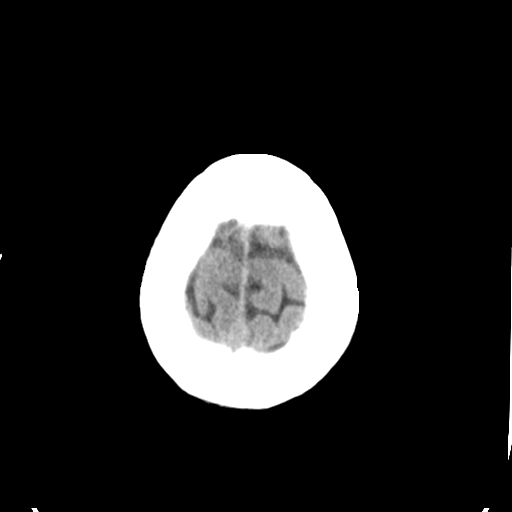

[Series 4: head bone · axial · 0.44mm/px · z∈[-93,-79]mm · 2 of 73 slices shown]
[im 8/73  bone]
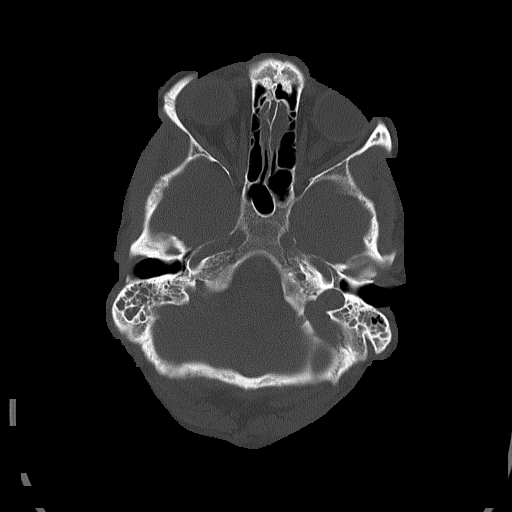
[im 15/73  bone]
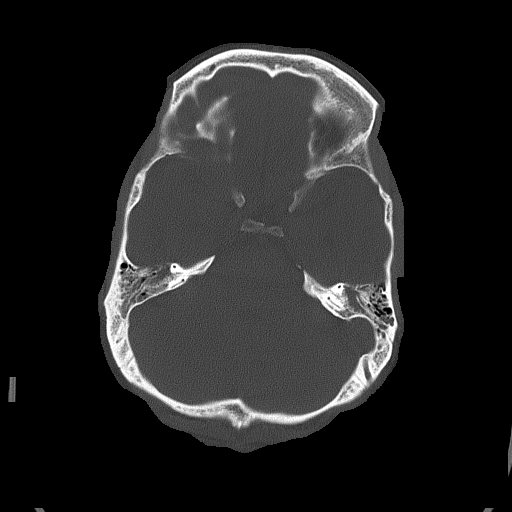

[Series 5: head without cor · coronal · non-contrast · 0.30mm/px · 3 of 70 slices shown]
[im 24/70  brain]
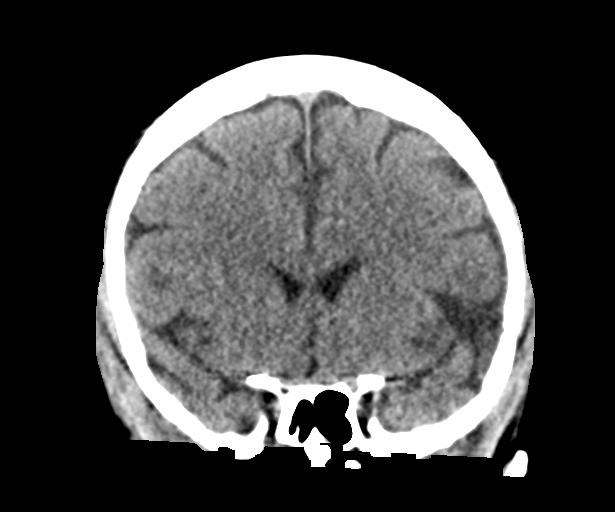
[im 31/70  brain]
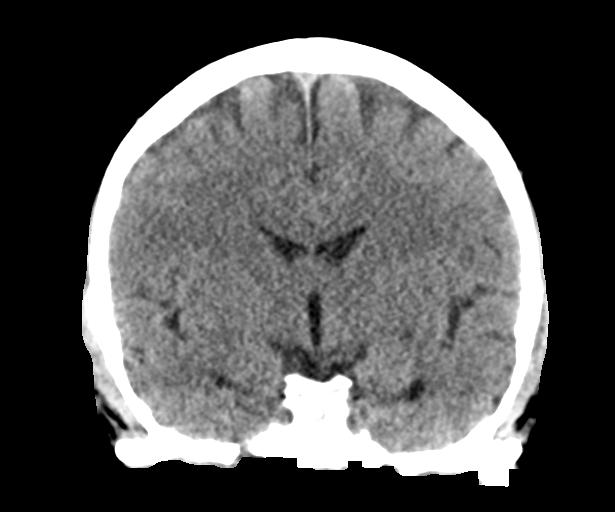
[im 39/70  brain]
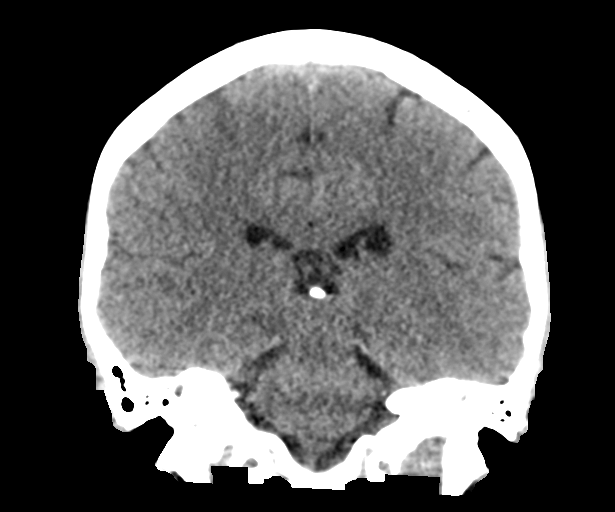

[Series 6: head without sag · sagittal · non-contrast · 0.29mm/px · 3 of 64 slices shown]
[im 22/64  brain]
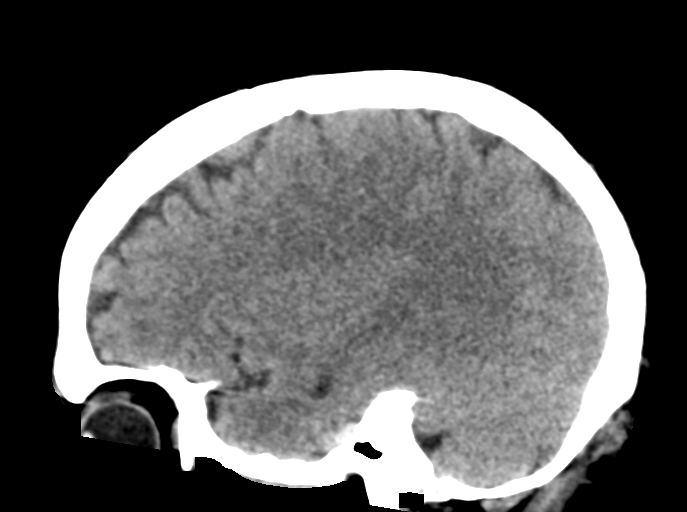
[im 32/64  brain]
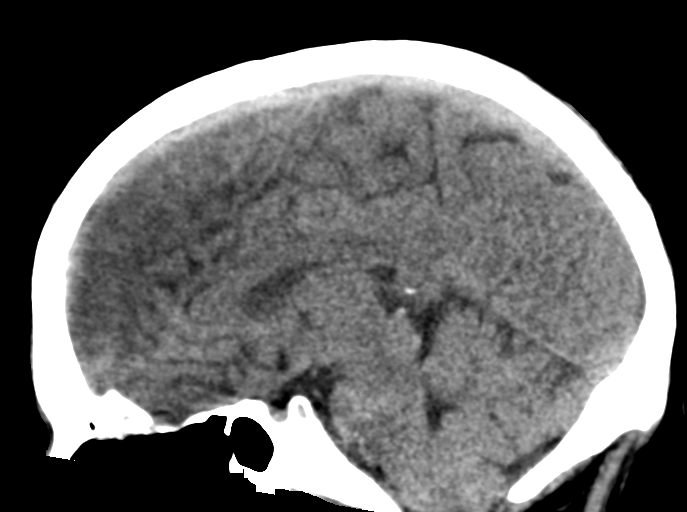
[im 43/64  brain]
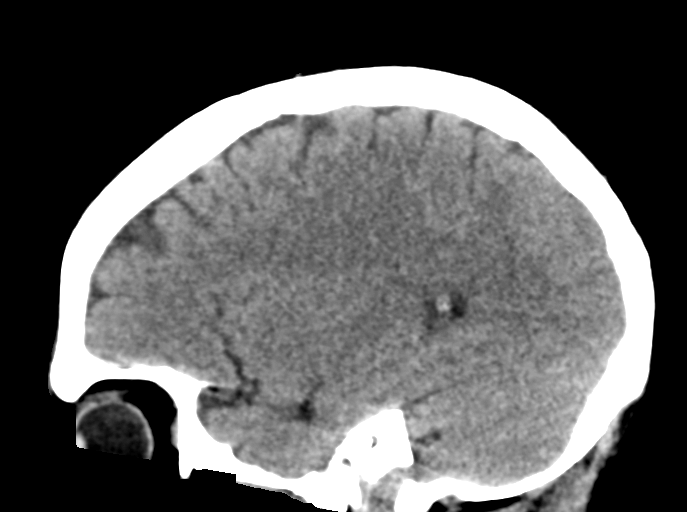

[15 of 47 positions shown; findings below may reference images not displayed]

FINDINGS: Brain: Ventricles and sulci are normal in size and configuration.
There is no appreciable intracranial mass, hemorrhage, extra-axial
fluid collection, or midline shift. There is preservation gray-white
differentiation. No edema evident. No findings indicative of acute
infarct.

Vascular: No hyperdense vessel.  No evident vascular calcification.

Skull: The bony calvarium appears intact.

Sinuses/Orbits: There is mucosal thickening in the left sphenoid
sinus. There is frontal sinus aplasia. Other visualized paranasal
sinuses are clear. Visualized orbits appear symmetric bilaterally.

Other: There is diffuse opacification of most mastoid air cells
bilaterally.
IMPRESSION: 1. No lesion involving brain parenchyma evident. There is
preservation of gray-white differentiation without evident edema or
focal infarct. No mass or hemorrhage.

2.  Opacification of most mastoid air cells bilaterally.

3.  Left sphenoid paranasal sinus disease.

ADDENDUM:
Original report by Dr. Beti Xona. Addendum by Dr. Saltos on 10/31/2020
at 8075 hours: The study was reviewed by telephone with Dr.
Moatshe. There is new hypoattenuation throughout the basal
ganglia and thalami bilaterally concerning for hypoxic ischemic
injury.

*** End of Addendum ***
FINDINGS: Brain: Ventricles and sulci are normal in size and configuration.
There is no appreciable intracranial mass, hemorrhage, extra-axial
fluid collection, or midline shift. There is preservation gray-white
differentiation. No edema evident. No findings indicative of acute
infarct.

Vascular: No hyperdense vessel.  No evident vascular calcification.

Skull: The bony calvarium appears intact.

Sinuses/Orbits: There is mucosal thickening in the left sphenoid
sinus. There is frontal sinus aplasia. Other visualized paranasal
sinuses are clear. Visualized orbits appear symmetric bilaterally.

Other: There is diffuse opacification of most mastoid air cells
bilaterally.
IMPRESSION: 1. No lesion involving brain parenchyma evident. There is
preservation of gray-white differentiation without evident edema or
focal infarct. No mass or hemorrhage.

2.  Opacification of most mastoid air cells bilaterally.

3.  Left sphenoid paranasal sinus disease.
